# Patient Record
Sex: Female | Born: 1975 | Race: White | Hispanic: Refuse to answer | Marital: Single | Smoking: Former smoker
Health system: Southern US, Community
[De-identification: ages and names within clinical notes are randomized; demographics above are authoritative.]

## PROBLEM LIST (undated history)

## (undated) DIAGNOSIS — G44229 Chronic tension-type headache, not intractable: Secondary | ICD-10-CM

## (undated) DIAGNOSIS — N200 Calculus of kidney: Secondary | ICD-10-CM

## (undated) DIAGNOSIS — Z87442 Personal history of urinary calculi: Secondary | ICD-10-CM

## (undated) DIAGNOSIS — K219 Gastro-esophageal reflux disease without esophagitis: Secondary | ICD-10-CM

## (undated) DIAGNOSIS — I10 Essential (primary) hypertension: Secondary | ICD-10-CM

---

## 2006-02-06 ENCOUNTER — Emergency Department: Payer: Self-pay | Admitting: Emergency Medicine

## 2006-09-23 ENCOUNTER — Emergency Department: Payer: Self-pay | Admitting: Unknown Physician Specialty

## 2007-03-04 ENCOUNTER — Emergency Department: Payer: Self-pay | Admitting: Emergency Medicine

## 2007-05-24 ENCOUNTER — Emergency Department: Payer: Self-pay

## 2007-11-14 ENCOUNTER — Emergency Department: Payer: Self-pay | Admitting: Emergency Medicine

## 2008-04-01 ENCOUNTER — Emergency Department: Payer: Self-pay | Admitting: Emergency Medicine

## 2009-05-22 ENCOUNTER — Observation Stay: Payer: Self-pay | Admitting: Obstetrics and Gynecology

## 2009-10-14 ENCOUNTER — Ambulatory Visit: Payer: Self-pay | Admitting: Cardiovascular Disease

## 2009-10-14 ENCOUNTER — Inpatient Hospital Stay: Payer: Self-pay | Admitting: Obstetrics & Gynecology

## 2009-10-21 ENCOUNTER — Inpatient Hospital Stay: Payer: Self-pay

## 2009-12-12 ENCOUNTER — Observation Stay: Payer: Self-pay

## 2009-12-14 ENCOUNTER — Observation Stay: Payer: Self-pay

## 2009-12-16 ENCOUNTER — Observation Stay: Payer: Self-pay

## 2009-12-18 ENCOUNTER — Inpatient Hospital Stay: Payer: Self-pay | Admitting: Obstetrics and Gynecology

## 2011-09-17 ENCOUNTER — Emergency Department: Payer: Self-pay | Admitting: Emergency Medicine

## 2011-09-17 LAB — URINALYSIS, COMPLETE
Bilirubin,UR: NEGATIVE
Nitrite: NEGATIVE
Protein: 30
Specific Gravity: 1.019 (ref 1.003–1.030)
WBC UR: 6 /HPF (ref 0–5)

## 2012-12-22 ENCOUNTER — Emergency Department: Payer: Self-pay | Admitting: Internal Medicine

## 2012-12-22 LAB — URINALYSIS, COMPLETE
Bacteria: NONE SEEN
Bilirubin,UR: NEGATIVE
Blood: NEGATIVE
Glucose,UR: NEGATIVE mg/dL (ref 0–75)
Ketone: NEGATIVE
Protein: NEGATIVE
RBC,UR: <1 /HPF (ref 0–5)
Specific Gravity: 1.011 (ref 1.003–1.030)
Squamous Epithelial: 1
WBC UR: 1 /HPF (ref 0–5)

## 2012-12-22 LAB — COMPREHENSIVE METABOLIC PANEL
Albumin: 4 g/dL (ref 3.4–5.0)
BUN: 12 mg/dL (ref 7–18)
Bilirubin,Total: 0.5 mg/dL (ref 0.2–1.0)
Calcium, Total: 9.6 mg/dL (ref 8.5–10.1)
Chloride: 105 mmol/L (ref 98–107)
EGFR (Non-African Amer.): >60
Osmolality: 273 (ref 275–301)
Potassium: 3.9 mmol/L (ref 3.5–5.1)
SGPT (ALT): 31 U/L (ref 12–78)
Sodium: 137 mmol/L (ref 136–145)
Total Protein: 7.4 g/dL (ref 6.4–8.2)

## 2012-12-22 LAB — CBC
HCT: 38.9 % (ref 35.0–47.0)
HGB: 13.9 g/dL (ref 12.0–16.0)
MCHC: 35.7 g/dL (ref 32.0–36.0)
Platelet: 217 10*3/uL (ref 150–440)
RBC: 4.21 10*6/uL (ref 3.80–5.20)
RDW: 12.4 % (ref 11.5–14.5)
WBC: 5 10*3/uL (ref 3.6–11.0)

## 2014-11-10 ENCOUNTER — Encounter: Payer: Self-pay | Admitting: General Practice

## 2014-11-10 ENCOUNTER — Emergency Department: Payer: No Typology Code available for payment source

## 2014-11-10 ENCOUNTER — Emergency Department
Admission: EM | Admit: 2014-11-10 | Discharge: 2014-11-10 | Disposition: A | Discharge status: Home / Self Care | Payer: No Typology Code available for payment source | Attending: Emergency Medicine | Admitting: Emergency Medicine

## 2014-11-10 DIAGNOSIS — Z720 Tobacco use: Secondary | ICD-10-CM | POA: Insufficient documentation

## 2014-11-10 DIAGNOSIS — N200 Calculus of kidney: Secondary | ICD-10-CM | POA: Diagnosis not present

## 2014-11-10 DIAGNOSIS — Z79899 Other long term (current) drug therapy: Secondary | ICD-10-CM | POA: Diagnosis not present

## 2014-11-10 DIAGNOSIS — R109 Unspecified abdominal pain: Secondary | ICD-10-CM

## 2014-11-10 DIAGNOSIS — Z3202 Encounter for pregnancy test, result negative: Secondary | ICD-10-CM | POA: Insufficient documentation

## 2014-11-10 HISTORY — DX: Calculus of kidney: N20.0

## 2014-11-10 LAB — COMPREHENSIVE METABOLIC PANEL
ALT: 14 U/L (ref 14–54)
AST: 26 U/L (ref 15–41)
Albumin: 3.9 g/dL (ref 3.5–5.0)
Alkaline Phosphatase: 46 U/L (ref 38–126)
Anion gap: 7 (ref 5–15)
BUN: 22 mg/dL — ABNORMAL HIGH (ref 6–20)
CALCIUM: 8.9 mg/dL (ref 8.9–10.3)
CO2: 23 mmol/L (ref 22–32)
Chloride: 110 mmol/L (ref 101–111)
Creatinine, Ser: 0.92 mg/dL (ref 0.44–1.00)
GFR calc non Af Amer: >60 mL/min (ref >60)
GLUCOSE: 89 mg/dL (ref 65–99)
Potassium: 5.1 mmol/L (ref 3.5–5.1)
Sodium: 140 mmol/L (ref 135–145)
Total Bilirubin: 0.5 mg/dL (ref 0.3–1.2)
Total Protein: 6.5 g/dL (ref 6.5–8.1)

## 2014-11-10 LAB — URINALYSIS COMPLETE WITH MICROSCOPIC (ARMC ONLY)
BACTERIA UA: NONE SEEN
Bilirubin Urine: NEGATIVE
GLUCOSE, UA: NEGATIVE mg/dL
Ketones, ur: NEGATIVE mg/dL
NITRITE: NEGATIVE
PROTEIN: 30 mg/dL — AB
Specific Gravity, Urine: 1.016 (ref 1.005–1.030)
pH: 8 (ref 5.0–8.0)

## 2014-11-10 LAB — CBC WITH DIFFERENTIAL/PLATELET
Basophils Absolute: 0 10*3/uL (ref 0–0.1)
Basophils Relative: 1 %
EOS ABS: 0.6 10*3/uL (ref 0–0.7)
EOS PCT: 9 %
HCT: 37.6 % (ref 35.0–47.0)
HEMOGLOBIN: 12.7 g/dL (ref 12.0–16.0)
LYMPHS ABS: 1.7 10*3/uL (ref 1.0–3.6)
LYMPHS PCT: 24 %
MCH: 32.3 pg (ref 26.0–34.0)
MCHC: 33.7 g/dL (ref 32.0–36.0)
MCV: 95.9 fL (ref 80.0–100.0)
MONOS PCT: 12 %
Monocytes Absolute: 0.8 10*3/uL (ref 0.2–0.9)
NEUTROS PCT: 54 %
Neutro Abs: 3.8 10*3/uL (ref 1.4–6.5)
Platelets: 202 10*3/uL (ref 150–440)
RBC: 3.93 MIL/uL (ref 3.80–5.20)
RDW: 13.3 % (ref 11.5–14.5)
WBC: 6.9 10*3/uL (ref 3.6–11.0)

## 2014-11-10 LAB — LIPASE, BLOOD: LIPASE: 41 U/L (ref 22–51)

## 2014-11-10 LAB — POCT PREGNANCY, URINE: Preg Test, Ur: NEGATIVE

## 2014-11-10 MED ORDER — SODIUM CHLORIDE 0.9 % IV BOLUS (SEPSIS)
1000.0000 mL | Freq: Once | INTRAVENOUS | Status: AC
  Administered 2014-11-10: 1000 mL via INTRAVENOUS

## 2014-11-10 MED ORDER — ONDANSETRON HCL 4 MG/2ML IJ SOLN
4.0000 mg | INTRAMUSCULAR | Status: AC
  Administered 2014-11-10: 4 mg via INTRAVENOUS

## 2014-11-10 MED ORDER — ONDANSETRON HCL 4 MG/2ML IJ SOLN
INTRAMUSCULAR | Status: AC
  Administered 2014-11-10: 4 mg via INTRAVENOUS
  Filled 2014-11-10: qty 2

## 2014-11-10 MED ORDER — KETOROLAC TROMETHAMINE 30 MG/ML IJ SOLN
INTRAMUSCULAR | Status: AC
  Administered 2014-11-10: 30 mg via INTRAVENOUS
  Filled 2014-11-10: qty 1

## 2014-11-10 MED ORDER — MORPHINE SULFATE 4 MG/ML IJ SOLN
4.0000 mg | Freq: Once | INTRAMUSCULAR | Status: AC
  Administered 2014-11-10: 4 mg via INTRAVENOUS

## 2014-11-10 MED ORDER — ONDANSETRON 4 MG PO TBDP: 4.0000 mg | ORAL_TABLET | Freq: Four times a day (QID) | ORAL | Status: DC | PRN

## 2014-11-10 MED ORDER — KETOROLAC TROMETHAMINE 30 MG/ML IJ SOLN
30.0000 mg | Freq: Once | INTRAMUSCULAR | Status: AC
  Administered 2014-11-10: 30 mg via INTRAVENOUS

## 2014-11-10 MED ORDER — OXYCODONE-ACETAMINOPHEN 5-325 MG PO TABS: 1.0000 | ORAL_TABLET | Freq: Four times a day (QID) | ORAL | Status: DC | PRN

## 2014-11-10 NOTE — ED Notes (Signed)
Patient transported to CT 

## 2014-11-10 NOTE — ED Notes (Signed)
Pt back from CT, additional anti nausea med given, family at bedside, awaiting CT results

## 2014-11-10 NOTE — Discharge Instructions (Signed)

## 2014-11-10 NOTE — ED Provider Notes (Signed)
[facility] Emergency Department Provider Note  ____________________________________________  Time seen: Approximately 8:55 AM  I have reviewed the triage vital signs and the nursing notes.   HISTORY  Chief Complaint Flank Pain; Abdominal Pain; Hematuria; and Nausea    HPI Amber Mcbride is a 39 y.o. female states she has a history of previous kidney stones. She states yesterday she had a "nagging" feeling as if a kidney stone may be starting in her left flank. This morning she woke up and had severe sharp pain in her left flank which radiates down towards the left lower groin. States this feels just like previous kidney stones. She does report having to have a stent for a kidney stone that was about 12 mm in size previously. She has been seen by [facility] in the past.  She currently denies taking any pain medications. She is allergic to no medications. She denies pregnancy. No vaginal symptoms. No vaginal bleeding or discharge. No nausea or vomiting. No chest pain or shortness of breath.  Pain is severe. 10 out of 10. Located in the left flank.  Past Medical History  Diagnosis Date  . Kidney stones     There are no active problems to display for this patient.   History reviewed. No pertinent past surgical history.  Current Outpatient Rx  Name  Route  Sig  Dispense  Refill  . losartan (COZAAR) 25 MG tablet   Oral   Take 25 mg by mouth daily.      11   . omeprazole (PRILOSEC) 20 MG capsule   Oral   Take 20 mg by mouth daily as needed.      11   . valACYclovir (VALTREX) 500 MG tablet   Oral   Take 500 mg by mouth daily.      11   . ondansetron (ZOFRAN ODT) 4 MG disintegrating tablet   Oral   Take 1 tablet (4 mg total) by mouth every 6 (six) hours as needed for nausea or vomiting.   20 tablet   0   . oxyCODONE-acetaminophen (ROXICET) 5-325 MG per tablet   Oral   Take 1 tablet by mouth every 6 (six) hours as needed for severe pain.    20 tablet   0     Allergies Review of patient's allergies indicates no known allergies.  No family history on file.  Social History History  Substance Use Topics  . Smoking status: Current Every Day Smoker -- 1.00 packs/day    Types: Cigarettes  . Smokeless tobacco: Not on file  . Alcohol Use: No    Review of Systems Constitutional: No fever/chills Eyes: No visual changes. ENT: No sore throat. Cardiovascular: Denies chest pain. Respiratory: Denies shortness of breath. Gastrointestinal: See history of present illness No nausea, no vomiting.  No diarrhea.  No constipation. Genitourinary: Negative for dysuria. Musculoskeletal: Negative for back pain. Skin: Negative for rash. Neurological: Negative for headaches, focal weakness or numbness.  10-point ROS otherwise negative.  ____________________________________________   PHYSICAL EXAM:  VITAL SIGNS: ED Triage Vitals  Enc Vitals Group     BP 11/10/14 0819 138/86 mmHg     Pulse Rate 11/10/14 0819 89     Resp 11/10/14 0819 19     Temp 11/10/14 0819 97.9 F (36.6 C)     Temp Source 11/10/14 0819 Oral     SpO2 11/10/14 0819 97 %     Weight 11/10/14 0819 132 lb (59.875 kg)  Height 11/10/14 0819  (1.6 m)     Head Cir --      Peak Flow --      Pain Score 11/10/14 0820 10     Pain Loc --      Pain Edu? --      Excl. in GC? --     Constitutional: Alert and oriented. Holding her left flank writhing somewhat in bed. She appears to be in severe pain.  Eyes: Conjunctivae are normal. PERRL. EOMI. Head: Atraumatic. Nose: No congestion/rhinnorhea. Mouth/Throat: Mucous membranes are moist.  Oropharynx non-erythematous. Neck: No stridor.   Cardiovascular: Normal rate, regular rhythm. Grossly normal heart sounds.  Good peripheral circulation. Respiratory: Normal respiratory effort.  No retractions. Lungs CTAB. Gastrointestinal: Soft and nontender with exception of moderate tenderness the left lank without rebound or  guarding. No distention. No abdominal bruits. There is CVA tenderness on the left. None on the right. Musculoskeletal: No lower extremity tenderness nor edema.  No joint effusions. Neurologic:  Normal speech and language. No gross focal neurologic deficits are appreciated. Speech is normal.  Skin:  Skin is warm, dry and intact. No rash noted. Psychiatric: Mood and affect are normal. Speech and behavior are normal.  ____________________________________________   LABS (all labs ordered are listed, but only abnormal results are displayed)  Labs Reviewed  COMPREHENSIVE METABOLIC PANEL - Abnormal; Notable for the following:    BUN 22 (*)    All other components within normal limits  URINALYSIS COMPLETEWITH MICROSCOPIC ([facility] ONLY) - Abnormal; Notable for the following:    Color, Urine YELLOW (*)    APPearance CLOUDY (*)    Hgb urine dipstick 3+ (*)    Protein, ur 30 (*)    Leukocytes, UA TRACE (*)    Squamous Epithelial / LPF 0-5 (*)    All other components within normal limits  CBC WITH DIFFERENTIAL/PLATELET  LIPASE, BLOOD  POC URINE PREG, ED  POC URINE PREG, ED  POCT PREGNANCY, URINE   ____________________________________________  EKG   ____________________________________________  RADIOLOGY  CT Abdomen Pelvis W Contrast (Final result) Result time: 11/10/14 11:28:32   Final result by Rad Results In Interface (11/10/14 11:28:32)   Narrative:   CLINICAL DATA: Severe mid abdominal/back pain since fall and seizure, history of melanoma  EXAM: CT ABDOMEN AND PELVIS WITH CONTRAST  TECHNIQUE: Multidetector CT imaging of the abdomen and pelvis was performed using the standard protocol following bolus administration of intravenous contrast.  CONTRAST: OMNIPAQUE IOHEXOL 300 MG/ML SOLN  COMPARISON: None.  FINDINGS: Motion degraded images.  Lower chest: Mild dependent atelectasis at the right lung base.  Hepatobiliary: Liver is within normal  limits.  Gallbladder is unremarkable. No intrahepatic or extrahepatic ductal dilatation.  Pancreas: Within normal limits.  Spleen: Within normal limits.  Adrenals/Urinary Tract: Adrenal glands are within normal limits.  Kidneys within normal limits. No hydronephrosis.  Thick-walled bladder.  Stomach/Bowel: Stomach is within normal limits.  No evidence of bowel obstruction.  Prior appendectomy.  Vascular/Lymphatic: Atherosclerotic calcifications of the abdominal aorta and branch vessels.  No suspicious abdominopelvic lymphadenopathy.  Reproductive: Prostate is notable for dystrophic calcifications.  Other: No abdominopelvic ascites.  Musculoskeletal: Degenerative changes of the lower lumbar spine, most prominent at L4-5 and L5-S1.  No fracture is seen.  IMPRESSION: Motion degraded images.  No evidence of traumatic injury to the abdomen/ pelvis.  Thick-walled bladder.   Electronically Signed By: Charline Bills M.D. On: 11/10/2014 11:28          CT Head Wo  Contrast (Final result) Result time: 11/10/14 09:28:09   Final result by Rad Results In Interface (11/10/14 09:28:09)   Narrative:   CLINICAL DATA: Seizure and fall  EXAM: CT HEAD WITHOUT CONTRAST  CT CERVICAL SPINE WITHOUT CONTRAST  TECHNIQUE: Multidetector CT imaging of the head and cervical spine was performed following the standard protocol without intravenous contrast. Multiplanar CT image reconstructions of the cervical spine were also generated.  COMPARISON: 10/31/2014  FINDINGS: CT HEAD FINDINGS  There is no mass effect, midline shift, or acute intracranial hemorrhage. Soft tissue swelling over the left frontal bone is noted. Extensive consolidation in the paranasal sinuses is not significantly changed. Mastoid air cells are clear. Prominent adenoidal lymphoid tissue is noted. No skull fracture.  CT CERVICAL SPINE FINDINGS  No acute fracture. No  dislocation.  Advanced degenerative change throughout the cervical spine is present. There is severe disc space narrowing and vacuum at C4-5 with posterior osteophytic ridging. Right sided uncovertebral osteophytes cause right foraminal narrowing. There is mild disc narrowing at C5-6 with bilateral uncovertebral osteophytes. Moderate C6-7 disc space narrowing with bilateral uncovertebral osteophytes.  Emphysema at the lung apices is prominent.  No obvious spinal hemorrhage or soft tissue injury.  IMPRESSION: No acute intracranial pathology. No evidence of cervical spine injury.   Electronically Signed By: Jolaine Click M.D. On: 11/10/2014 09:28          CT Cervical Spine Wo Contrast (Final result) Result time: 11/10/14 09:28:09   Final result by Rad Results In Interface (11/10/14 09:28:09)   Narrative:   CLINICAL DATA: Seizure and fall  EXAM: CT HEAD WITHOUT CONTRAST  CT CERVICAL SPINE WITHOUT CONTRAST  TECHNIQUE: Multidetector CT imaging of the head and cervical spine was performed following the standard protocol without intravenous contrast. Multiplanar CT image reconstructions of the cervical spine were also generated.  COMPARISON: 10/31/2014  FINDINGS: CT HEAD FINDINGS  There is no mass effect, midline shift, or acute intracranial hemorrhage. Soft tissue swelling over the left frontal bone is noted. Extensive consolidation in the paranasal sinuses is not significantly changed. Mastoid air cells are clear. Prominent adenoidal lymphoid tissue is noted. No skull fracture.  CT CERVICAL SPINE FINDINGS  No acute fracture. No dislocation.  Advanced degenerative change throughout the cervical spine is present. There is severe disc space narrowing and vacuum at C4-5 with posterior osteophytic ridging. Right sided uncovertebral osteophytes cause right foraminal narrowing. There is mild disc narrowing at C5-6 with bilateral uncovertebral osteophytes.  Moderate C6-7 disc space narrowing with bilateral uncovertebral osteophytes.  Emphysema at the lung apices is prominent.  No obvious spinal hemorrhage or soft tissue injury.  IMPRESSION: No acute intracranial pathology. No evidence of cervical spine injury.     ____________________________________________   PROCEDURES  Procedure(s) performed: None  Critical Care performed: No  ____________________________________________   INITIAL IMPRESSION / ASSESSMENT AND PLAN / ED COURSE  Pertinent labs & imaging results that were available during my care of the patient were reviewed by me and considered in my medical decision making (see chart for details).  Severe left flank pain. This is likely recurrent kidney stone, but given the patient's history of large kidney stones requiring stenting in the past, I will obtain CT imaging to evaluate for sizable stone or other acute etiology of pain. Patient does not appear to have any gynecologic symptoms, no gastrointestinal symptoms other than left flank pain which is likely due to kidney stone. No cardiopulmonary symptoms. We will control pain, obtain basic labs, urinalysis, and urine pregnancy test.  We'll plan to perform CT stone protocol to further elucidate etiology of pain.  ----------------------------------------- 9:17 AM on 11/10/2014 -----------------------------------------  Patient continues. Severe pain, reports no relief with morphine. We'll give Toradol at this time, she states is no chance she could be pregnant she has no previous history of any kidney disease. No allergies to any NSAIDs.  ----------------------------------------- 12:55 PM on 11/10/2014 -----------------------------------------  Patient is resting comfortably and her pain is much control. She does have a left ureteral stone. I will discharge her to home with pain control, there is a decent likely that she will pass this on her own. I discussed with her  appropriate and safe use of Percocet and she is agreeable. Return precautions advised. She'll follow up with [facility] urology. No evidence of infection. ____________________________________________  Discussed with the patient and her husband no driving while taking Percocet or today, patient is agreeable.  FINAL CLINICAL IMPRESSION(S) / ED DIAGNOSES  Final diagnoses:  Left flank pain  Kidney stone      Sharyn Creamer, MD 11/10/14 1258

## 2014-11-10 NOTE — ED Notes (Signed)
Pt. Arrived to ed from home with reports of experiencing left flank pain radiating to left lower abdomen. Pt reports hx of kidney stones. Pt alert and oriented. Pt vebalized having blood in her urine.

## 2014-11-15 ENCOUNTER — Emergency Department: Payer: No Typology Code available for payment source

## 2014-11-15 ENCOUNTER — Encounter: Payer: Self-pay | Admitting: Emergency Medicine

## 2014-11-15 ENCOUNTER — Emergency Department
Admission: EM | Admit: 2014-11-15 | Discharge: 2014-11-15 | Disposition: A | Discharge status: Home / Self Care | Payer: No Typology Code available for payment source | Attending: Emergency Medicine | Admitting: Emergency Medicine

## 2014-11-15 DIAGNOSIS — Z720 Tobacco use: Secondary | ICD-10-CM | POA: Insufficient documentation

## 2014-11-15 DIAGNOSIS — K5901 Slow transit constipation: Secondary | ICD-10-CM | POA: Diagnosis not present

## 2014-11-15 DIAGNOSIS — R109 Unspecified abdominal pain: Secondary | ICD-10-CM | POA: Diagnosis present

## 2014-11-15 DIAGNOSIS — R1084 Generalized abdominal pain: Secondary | ICD-10-CM

## 2014-11-15 LAB — BASIC METABOLIC PANEL
Anion gap: 7 (ref 5–15)
BUN: 18 mg/dL (ref 6–20)
CALCIUM: 9.2 mg/dL (ref 8.9–10.3)
CHLORIDE: 105 mmol/L (ref 101–111)
CO2: 28 mmol/L (ref 22–32)
Creatinine, Ser: 1.03 mg/dL — ABNORMAL HIGH (ref 0.44–1.00)
GFR calc Af Amer: >60 mL/min (ref >60)
Glucose, Bld: 88 mg/dL (ref 65–99)
Potassium: 3.6 mmol/L (ref 3.5–5.1)
Sodium: 140 mmol/L (ref 135–145)

## 2014-11-15 LAB — URINALYSIS COMPLETE WITH MICROSCOPIC (ARMC ONLY)
Bacteria, UA: NONE SEEN
Bilirubin Urine: NEGATIVE
GLUCOSE, UA: NEGATIVE mg/dL
Hgb urine dipstick: NEGATIVE
Ketones, ur: NEGATIVE mg/dL
NITRITE: NEGATIVE
Protein, ur: NEGATIVE mg/dL
Specific Gravity, Urine: 1.019 (ref 1.005–1.030)
pH: 6 (ref 5.0–8.0)

## 2014-11-15 LAB — CBC WITH DIFFERENTIAL/PLATELET
BASOS ABS: 0.1 10*3/uL (ref 0–0.1)
Basophils Relative: 1 %
EOS PCT: 6 %
Eosinophils Absolute: 0.4 10*3/uL (ref 0–0.7)
HEMATOCRIT: 38.2 % (ref 35.0–47.0)
HEMOGLOBIN: 13 g/dL (ref 12.0–16.0)
LYMPHS PCT: 23 %
Lymphs Abs: 1.6 10*3/uL (ref 1.0–3.6)
MCH: 32.2 pg (ref 26.0–34.0)
MCHC: 34 g/dL (ref 32.0–36.0)
MCV: 94.5 fL (ref 80.0–100.0)
Monocytes Absolute: 0.9 10*3/uL (ref 0.2–0.9)
Monocytes Relative: 13 %
NEUTROS ABS: 4.1 10*3/uL (ref 1.4–6.5)
NEUTROS PCT: 57 %
Platelets: 210 10*3/uL (ref 150–440)
RBC: 4.05 MIL/uL (ref 3.80–5.20)
RDW: 13.6 % (ref 11.5–14.5)
WBC: 7.1 10*3/uL (ref 3.6–11.0)

## 2014-11-15 MED ORDER — HYDROMORPHONE HCL 1 MG/ML IJ SOLN
1.0000 mg | Freq: Once | INTRAMUSCULAR | Status: AC
  Administered 2014-11-15: 1 mg via INTRAVENOUS
  Filled 2014-11-15: qty 1

## 2014-11-15 MED ORDER — ONDANSETRON HCL 4 MG/2ML IJ SOLN
4.0000 mg | Freq: Once | INTRAMUSCULAR | Status: AC
  Administered 2014-11-15: 4 mg via INTRAVENOUS
  Filled 2014-11-15: qty 2

## 2014-11-15 MED ORDER — ONDANSETRON HCL 4 MG/2ML IJ SOLN
4.0000 mg | Freq: Once | INTRAMUSCULAR | Status: AC
  Administered 2014-11-15: 4 mg via INTRAVENOUS

## 2014-11-15 MED ORDER — ONDANSETRON HCL 4 MG/2ML IJ SOLN
INTRAMUSCULAR | Status: AC
  Filled 2014-11-15: qty 2

## 2014-11-15 MED ORDER — SODIUM CHLORIDE 0.9 % IV SOLN
Freq: Once | INTRAVENOUS | Status: AC
  Administered 2014-11-15: 15:00:00 via INTRAVENOUS

## 2014-11-15 MED ORDER — POLYETHYLENE GLYCOL 3350 17 G PO PACK: 17.0000 g | PACK | Freq: Every day | ORAL | Status: DC

## 2014-11-15 NOTE — ED Provider Notes (Signed)
[facility] Emergency Department Provider Note     Time seen: ----------------------------------------- 2:40 PM on 11/15/2014 -----------------------------------------    I have reviewed the triage vital signs and the nursing notes.   HISTORY  Chief Complaint Flank Pain    HPI Amber Mcbride is a 39 y.o. female who presents ER after she was recently diagnosed with kidney stones. Patient states she is having pain that wraps around the front of the abdomen. Patient reports she has not been in to see a urologist because there is a waiting list currently. Denies fevers chills other complaints. Denies any vomiting. States Percocet is not really helping her pain. She is also taking Zofran and Goody powders. Pain is currently severe and bilateral flanks. Nothing makes it worse.   Past Medical History  Diagnosis Date  . Kidney stones     There are no active problems to display for this patient.   History reviewed. No pertinent past surgical history.  Allergies Review of patient's allergies indicates no known allergies.  Social History History  Substance Use Topics  . Smoking status: Current Every Day Smoker -- 1.00 packs/day    Types: Cigarettes  . Smokeless tobacco: Not on file  . Alcohol Use: No    Review of Systems Constitutional: Negative for fever. Eyes: Negative for visual changes. ENT: Negative for sore throat. Cardiovascular: Negative for chest pain. Respiratory: Negative for shortness of breath. Gastrointestinal: Positive for bilateral abdominal pain Genitourinary: Negative for dysuria. Musculoskeletal: Positive for low back pain and flank pain bilaterally Skin: Negative for rash. Neurological: Negative for headaches, focal weakness or numbness.  10-point ROS otherwise negative.  ____________________________________________   PHYSICAL EXAM:  VITAL SIGNS: ED Triage Vitals  Enc Vitals Group     BP 11/15/14 1412 124/76 mmHg   Pulse Rate 11/15/14 1412 85     Resp 11/15/14 1412 16     Temp 11/15/14 1412 97.8 F (36.6 C)     Temp Source 11/15/14 1412 Oral     SpO2 11/15/14 1412 100 %     Weight 11/15/14 1412 133 lb (60.328 kg)     Height 11/15/14 1412  (1.6 m)     Head Cir --      Peak Flow --      Pain Score 11/15/14 1413 8     Pain Loc --      Pain Edu? --      Excl. in GC? --     Constitutional: Alert and oriented. Well appearing and in no distress. Eyes: Conjunctivae are normal. PERRL. Normal extraocular movements. ENT   Head: Normocephalic and atraumatic.   Nose: No congestion/rhinnorhea.   Mouth/Throat: Mucous membranes are moist.   Neck: No stridor. Hematological/Lymphatic/Immunilogical: No cervical lymphadenopathy. Cardiovascular: Normal rate, regular rhythm. Normal and symmetric distal pulses are present in all extremities. No murmurs, rubs, or gallops. Respiratory: Normal respiratory effort without tachypnea nor retractions. Breath sounds are clear and equal bilaterally. No wheezes/rales/rhonchi. Gastrointestinal: Soft and nontender. No distention. No abdominal bruits. There is no CVA tenderness. Musculoskeletal: Nontender with normal range of motion in all extremities. No joint effusions.  No lower extremity tenderness nor edema. Neurologic:  Normal speech and language. No gross focal neurologic deficits are appreciated. Speech is normal. No gait instability. Skin:  Skin is warm, dry and intact. No rash noted. Psychiatric: Mood and affect are normal. Speech and behavior are normal. Patient exhibits appropriate insight and judgment. ____________________________________________  ED COURSE:  Pertinent labs & imaging results that  were available during my care of the patient were reviewed by me and considered in my medical decision making (see chart for details). We'll recheck his urine, KUB and give IV pain medication. ____________________________________________    LABS  (pertinent positives/negatives)  Labs Reviewed  URINALYSIS COMPLETEWITH MICROSCOPIC ([facility] ONLY) - Abnormal; Notable for the following:    Color, Urine YELLOW (*)    APPearance CLEAR (*)    Leukocytes, UA TRACE (*)    Squamous Epithelial / LPF 0-5 (*)    All other components within normal limits  BASIC METABOLIC PANEL - Abnormal; Notable for the following:    Creatinine, Ser 1.03 (*)    All other components within normal limits  CBC WITH DIFFERENTIAL/PLATELET    RADIOLOGY Images were viewed by me  KUB Reveals intrarenal stones, she has likely passed her previous stone ____________________________________________  FINAL ASSESSMENT AND PLAN  Flank pain, constipation  Plan: Patient with abdominal pain and likely recently passed kidney stone. Her pain at this point is probably from constipation. Her urine has cleared up and her x-rays unremarkable this time she'll be discharged with MiraLAX and follow up as needed.   Emily FilbertWilliams, Jonathan E, MD   Emily FilbertJonathan E Williams, MD 11/15/14 [phone]

## 2014-11-15 NOTE — ED Notes (Addendum)
Reports dx with kidney stones Saturday.  States she can not get a f/u apt until mid august.  Still having pain.

## 2014-11-15 NOTE — ED Notes (Signed)
Patient states she was here Saturday with diagnoses of kidney stones on the left side.  Patient now complaining of pain bilaterally and into her pubic area.  Patient has not been able to f/u with a urologist since she was discharged.  Has mild nausea and a headache.

## 2014-11-15 NOTE — Discharge Instructions (Signed)

## 2015-04-10 ENCOUNTER — Emergency Department: Payer: No Typology Code available for payment source

## 2015-04-10 ENCOUNTER — Emergency Department
Admission: EM | Admit: 2015-04-10 | Discharge: 2015-04-10 | Disposition: A | Discharge status: Home / Self Care | Payer: No Typology Code available for payment source | Attending: Emergency Medicine | Admitting: Emergency Medicine

## 2015-04-10 ENCOUNTER — Encounter: Payer: Self-pay | Admitting: *Deleted

## 2015-04-10 DIAGNOSIS — N23 Unspecified renal colic: Secondary | ICD-10-CM | POA: Insufficient documentation

## 2015-04-10 DIAGNOSIS — Z3202 Encounter for pregnancy test, result negative: Secondary | ICD-10-CM | POA: Insufficient documentation

## 2015-04-10 DIAGNOSIS — F17210 Nicotine dependence, cigarettes, uncomplicated: Secondary | ICD-10-CM | POA: Diagnosis not present

## 2015-04-10 DIAGNOSIS — Z79899 Other long term (current) drug therapy: Secondary | ICD-10-CM | POA: Diagnosis not present

## 2015-04-10 DIAGNOSIS — R109 Unspecified abdominal pain: Secondary | ICD-10-CM | POA: Diagnosis present

## 2015-04-10 HISTORY — DX: Calculus of kidney: N20.0

## 2015-04-10 LAB — BASIC METABOLIC PANEL
Anion gap: 5 (ref 5–15)
BUN: 17 mg/dL (ref 6–20)
CHLORIDE: 109 mmol/L (ref 101–111)
CO2: 25 mmol/L (ref 22–32)
Calcium: 8.8 mg/dL — ABNORMAL LOW (ref 8.9–10.3)
Creatinine, Ser: 0.79 mg/dL (ref 0.44–1.00)
GFR calc Af Amer: >60 mL/min (ref >60)
GFR calc non Af Amer: >60 mL/min (ref >60)
Glucose, Bld: 82 mg/dL (ref 65–99)
POTASSIUM: 3.7 mmol/L (ref 3.5–5.1)
Sodium: 139 mmol/L (ref 135–145)

## 2015-04-10 LAB — CBC WITH DIFFERENTIAL/PLATELET
BASOS ABS: 0 10*3/uL (ref 0–0.1)
BASOS PCT: 1 %
EOS ABS: 0.2 10*3/uL (ref 0–0.7)
Eosinophils Relative: 4 %
HCT: 39.3 % (ref 35.0–47.0)
Hemoglobin: 13.5 g/dL (ref 12.0–16.0)
Lymphocytes Relative: 25 %
Lymphs Abs: 1.4 10*3/uL (ref 1.0–3.6)
MCH: 33.9 pg (ref 26.0–34.0)
MCHC: 34.4 g/dL (ref 32.0–36.0)
MCV: 98.5 fL (ref 80.0–100.0)
Monocytes Absolute: 0.6 10*3/uL (ref 0.2–0.9)
Monocytes Relative: 10 %
NEUTROS ABS: 3.3 10*3/uL (ref 1.4–6.5)
NEUTROS PCT: 60 %
Platelets: 218 10*3/uL (ref 150–440)
RBC: 3.99 MIL/uL (ref 3.80–5.20)
RDW: 11.9 % (ref 11.5–14.5)
WBC: 5.5 10*3/uL (ref 3.6–11.0)

## 2015-04-10 LAB — URINALYSIS COMPLETE WITH MICROSCOPIC (ARMC ONLY)
Bacteria, UA: NONE SEEN
Bilirubin Urine: NEGATIVE
Glucose, UA: NEGATIVE mg/dL
Ketones, ur: NEGATIVE mg/dL
LEUKOCYTES UA: NEGATIVE
Nitrite: NEGATIVE
PH: 6 (ref 5.0–8.0)
Protein, ur: NEGATIVE mg/dL
Specific Gravity, Urine: 1.01 (ref 1.005–1.030)

## 2015-04-10 LAB — PREGNANCY, URINE: PREG TEST UR: NEGATIVE

## 2015-04-10 LAB — POCT PREGNANCY, URINE: Preg Test, Ur: NEGATIVE

## 2015-04-10 MED ORDER — ONDANSETRON 4 MG PO TBDP
4.0000 mg | ORAL_TABLET | Freq: Once | ORAL | Status: AC
  Administered 2015-04-10: 4 mg via ORAL

## 2015-04-10 MED ORDER — ONDANSETRON 4 MG PO TBDP
ORAL_TABLET | ORAL | Status: AC
  Administered 2015-04-10: 4 mg via ORAL
  Filled 2015-04-10: qty 1

## 2015-04-10 MED ORDER — ONDANSETRON HCL 4 MG/2ML IJ SOLN
4.0000 mg | Freq: Once | INTRAMUSCULAR | Status: AC
  Administered 2015-04-10: 4 mg via INTRAVENOUS
  Filled 2015-04-10: qty 2

## 2015-04-10 MED ORDER — TAMSULOSIN HCL 0.4 MG PO CAPS
0.4000 mg | ORAL_CAPSULE | Freq: Every day | ORAL | Status: DC
Start: 2015-04-10 — End: 2019-09-07

## 2015-04-10 MED ORDER — OXYCODONE-ACETAMINOPHEN 10-325 MG PO TABS: 1.0000 | ORAL_TABLET | Freq: Four times a day (QID) | ORAL | Status: AC | PRN

## 2015-04-10 MED ORDER — HYDROMORPHONE HCL 1 MG/ML IJ SOLN
1.0000 mg | Freq: Once | INTRAMUSCULAR | Status: AC
  Administered 2015-04-10: 1 mg via INTRAVENOUS
  Filled 2015-04-10: qty 1

## 2015-04-10 NOTE — ED Notes (Signed)
Pt here with c/o left flank pain x 5 days. Advises it's a "kidney stone". Pt with Hx of multiple stones

## 2015-04-10 NOTE — ED Provider Notes (Signed)
[facility] Emergency Department Provider Note  ____________________________________________  Time seen: Approximately 12:18 PM  I have reviewed the triage vital signs and the nursing notes.   HISTORY  Chief Complaint Nephrolithiasis    HPI Amber Mcbride is a 39 y.o. female patient describes left flank pain. This is consistent with her usual renal colic pain although not as severe as usually. Radiates around to the groin she denies any hematuria she denies any nausea vomiting or diarrhea   Past Medical History  Diagnosis Date  . Kidney stones   . Kidney stone     There are no active problems to display for this patient.   History reviewed. No pertinent past surgical history.  Current Outpatient Rx  Name  Route  Sig  Dispense  Refill  . losartan (COZAAR) 25 MG tablet   Oral   Take 25 mg by mouth daily.      11   . omeprazole (PRILOSEC) 20 MG capsule   Oral   Take 20 mg by mouth daily as needed.      11   . ondansetron (ZOFRAN ODT) 4 MG disintegrating tablet   Oral   Take 1 tablet (4 mg total) by mouth every 6 (six) hours as needed for nausea or vomiting.   20 tablet   0   . oxyCODONE-acetaminophen (PERCOCET) 10-325 MG tablet   Oral   Take 1 tablet by mouth every 6 (six) hours as needed for pain.   20 tablet   0   . oxyCODONE-acetaminophen (ROXICET) 5-325 MG per tablet   Oral   Take 1 tablet by mouth every 6 (six) hours as needed for severe pain.   20 tablet   0   . polyethylene glycol (MIRALAX / GLYCOLAX) packet   Oral   Take 17 g by mouth daily.   14 each   0   . tamsulosin (FLOMAX) 0.4 MG CAPS capsule   Oral   Take 1 capsule (0.4 mg total) by mouth daily.   7 capsule   0   . valACYclovir (VALTREX) 500 MG tablet   Oral   Take 500 mg by mouth daily.      11     Allergies Review of patient's allergies indicates no known allergies.  No family history on file.  Social History Social History  Substance Use  Topics  . Smoking status: Current Every Day Smoker -- 1.00 packs/day    Types: Cigarettes  . Smokeless tobacco: None  . Alcohol Use: No    Review of Systems es: No visual changes. ENT: No sore throat. Cardiovascular: Denies chest pain. Respiratory: Denies shortness of breath. Musculoskeletal: Negative for back pain. Skin: Negative for rash. Neurological: Negative for headaches, focal weakness or numbness.  10-point ROS otherwise negative.  ____________________________________________   PHYSICAL EXAM:  VITAL SIGNS: ED Triage Vitals  Enc Vitals Group     BP 04/10/15 1049 146/73 mmHg     Pulse Rate 04/10/15 1049 80     Resp 04/10/15 1049 18     Temp 04/10/15 1049 97.9 F (36.6 C)     Temp src --      SpO2 04/10/15 1049 100 %     Weight 04/10/15 1049 128 lb (58.06 kg)     Height 04/10/15 1049  (1.6 m)     Head Cir --      Peak Flow --      Pain Score 04/10/15 1050 8     Pain  Loc --      Pain Edu? --      Excl. in GC? --     Constitutional: Alert and oriented. Well appearing and in no acute distress. Eyes: Conjunctivae are normal. PERRL. EOMI. Head: Atraumatic. Nose: No congestion/rhinnorhea. Mouth/Throat: Mucous membranes are moist.  Oropharynx non-erythematous. Neck: No stridor. Cardiovascular: Normal rate, regular rhythm. Grossly normal heart sounds.  Good peripheral circulation. Respiratory: Normal respiratory effort.  No retractions. Lungs CTAB. Gastrointestinal: Soft mildly tender to palpation of the left side of the abdomen and suprapubically. There is no tenderness to percussion or rebound. No distention. No abdominal bruits. No CVA tenderness. Musculoskeletal: No lower extremity tenderness nor edema.  No joint effusions. There is some left-sided CVA tenderness Neurologic:  Normal speech and language. No gross focal neurologic deficits are appreciated. No gait instability. Skin:  Skin is warm, dry and intact. No rash  noted.  ____________________________________________   LABS (all labs ordered are listed, but only abnormal results are displayed)  Labs Reviewed  BASIC METABOLIC PANEL - Abnormal; Notable for the following:    Calcium 8.8 (*)    All other components within normal limits  URINALYSIS COMPLETEWITH MICROSCOPIC ([facility] ONLY) - Abnormal; Notable for the following:    Color, Urine STRAW (*)    APPearance CLEAR (*)    Hgb urine dipstick 3+ (*)    Squamous Epithelial / LPF 0-5 (*)    All other components within normal limits  PREGNANCY, URINE  CBC WITH DIFFERENTIAL/PLATELET  POCT PREGNANCY, URINE   ____________________________________________  EKG   ____________________________________________  RADIOLOGY  KUB shows 5-6 mm stone on the left side adjacent to L3 this undoubtedly represents a stone causing the patient's pain patient reports her pain is better at this time ____________________________________________   PROCEDURES    ____________________________________________   INITIAL IMPRESSION / ASSESSMENT AND PLAN / ED COURSE  Pertinent labs & imaging results that were available during my care of the patient were reviewed by me and considered in my medical decision making (see chart for details). On discharge patient doing well pain controlled  ____________________________________________   FINAL CLINICAL IMPRESSION(S) / ED DIAGNOSES  Final diagnoses:  Renal colic      Arnaldo NatalPaul F Tahirah Sara, MD 04/10/15 2149

## 2015-04-10 NOTE — Discharge Instructions (Signed)
Kidney Stones °Kidney stones (urolithiasis) are deposits that form inside your kidneys. The intense pain is caused by the stone moving through the urinary tract. When the stone moves, the ureter goes into spasm around the stone. The stone is usually passed in the urine.  °CAUSES  °· A disorder that makes certain neck glands produce too much parathyroid hormone (primary hyperparathyroidism). °· A buildup of uric acid crystals, similar to gout in your joints. °· Narrowing (stricture) of the ureter. °· A kidney obstruction present at birth (congenital obstruction). °· Previous surgery on the kidney or ureters. °· Numerous kidney infections. °SYMPTOMS  °· Feeling sick to your stomach (nauseous). °· Throwing up (vomiting). °· Blood in the urine (hematuria). °· Pain that usually spreads (radiates) to the groin. °· Frequency or urgency of urination. °DIAGNOSIS  °· Taking a history and physical exam. °· Blood or urine tests. °· CT scan. °· Occasionally, an examination of the inside of the urinary bladder (cystoscopy) is performed. °TREATMENT  °· Observation. °· Increasing your fluid intake. °· Extracorporeal shock wave lithotripsy--This is a noninvasive procedure that uses shock waves to break up kidney stones. °· Surgery may be needed if you have severe pain or persistent obstruction. There are various surgical procedures. Most of the procedures are performed with the use of small instruments. Only small incisions are needed to accommodate these instruments, so recovery time is minimized. °The size, location, and chemical composition are all important variables that will determine the proper choice of action for you. Talk to your health care provider to better understand your situation so that you will minimize the risk of injury to yourself and your kidney.  °HOME CARE INSTRUCTIONS  °· Drink enough water and fluids to keep your urine clear or pale yellow. This will help you to pass the stone or stone fragments. °· Strain  all urine through the provided strainer. Keep all particulate matter and stones for your health care provider to see. The stone causing the pain may be as small as a grain of salt. It is very important to use the strainer each and every time you pass your urine. The collection of your stone will allow your health care provider to analyze it and verify that a stone has actually passed. The stone analysis will often identify what you can do to reduce the incidence of recurrences. °· Only take over-the-counter or prescription medicines for pain, discomfort, or fever as directed by your health care provider. °· Keep all follow-up visits as told by your health care provider. This is important. °· Get follow-up X-rays if required. The absence of pain does not always mean that the stone has passed. It may have only stopped moving. If the urine remains completely obstructed, it can cause loss of kidney function or even complete destruction of the kidney. It is your responsibility to make sure X-rays and follow-ups are completed. Ultrasounds of the kidney can show blockages and the status of the kidney. Ultrasounds are not associated with any radiation and can be performed easily in a matter of minutes. °· Make changes to your daily diet as told by your health care provider. You may be told to: °¨ Limit the amount of salt that you eat. °¨ Eat 5 or more servings of fruits and vegetables each day. °¨ Limit the amount of meat, poultry, fish, and eggs that you eat. °· Collect a 24-hour urine sample as told by your health care provider. You may need to collect another urine sample every 6-12   months. SEEK MEDICAL CARE IF:  You experience pain that is progressive and unresponsive to any pain medicine you have been prescribed. SEEK IMMEDIATE MEDICAL CARE IF:   Pain cannot be controlled with the prescribed medicine.  You have a fever or shaking chills.  The severity or intensity of pain increases over 18 hours and is not  relieved by pain medicine.  You develop a new onset of abdominal pain.  You feel faint or pass out.  You are unable to urinate.   This information is not intended to replace advice given to you by your health care provider. Make sure you discuss any questions you have with your health care provider.   Document Released: 04/20/2005 Document Revised: 01/09/2015 Document Reviewed: 09/21/2012 Elsevier Interactive Patient Education [employer]  I also had [facility] to [facility] with Dr. Rosalia Hammersay nor Robert BellowAYNO Waller at 9 AM on December 22 they want you to bring her insurance card and a photo ID or don't have the insurance card than $100 in cash. Their phone number is [phone].   Please return here for you can go [facility] the [facility] if you have worse pain fever vomiting or feell sicker.

## 2016-09-10 IMAGING — CT CT RENAL STONE PROTOCOL
1 of 2 series · 4 of 32 positions shown, 9 images · non-contrast
Comparison: CT abdomen pelvis 09/17/2011

CLINICAL DATA: Patient with left flank pain radiating to the lower
abdomen for 2 days. Prior history of renal stones with lithotripsy.

EXAM:
CT ABDOMEN AND PELVIS WITHOUT CONTRAST
TECHNIQUE: Multidetector CT imaging of the abdomen and pelvis was performed
following the standard protocol without IV contrast.

[Series 4: lung windows · axial · 0.68mm/px · z∈[+543,+603]mm · 4 of 22 slices shown, 9 images]
[im 5/22  soft-tissue]
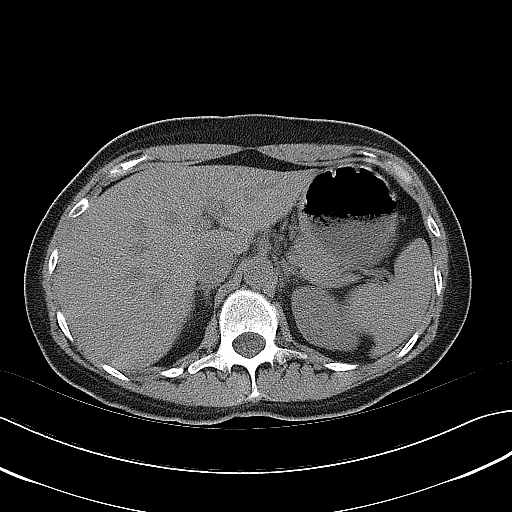
[im 5/22  lung]
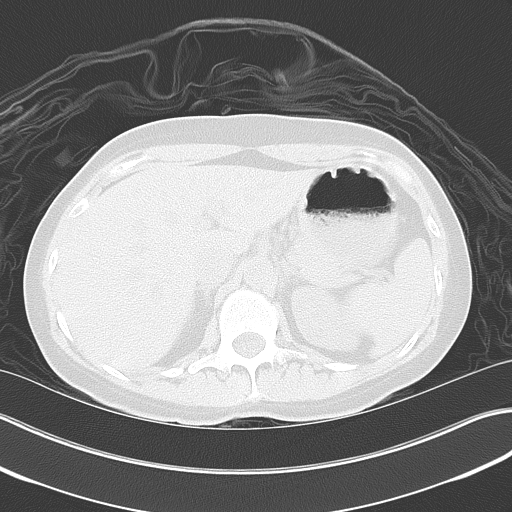
[im 5/22  bone]
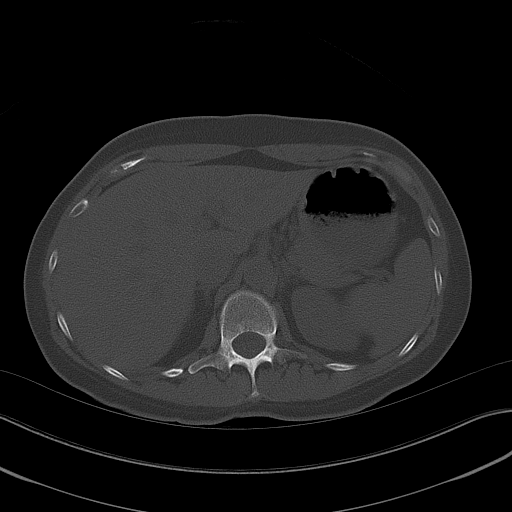
[im 9/22  soft-tissue]
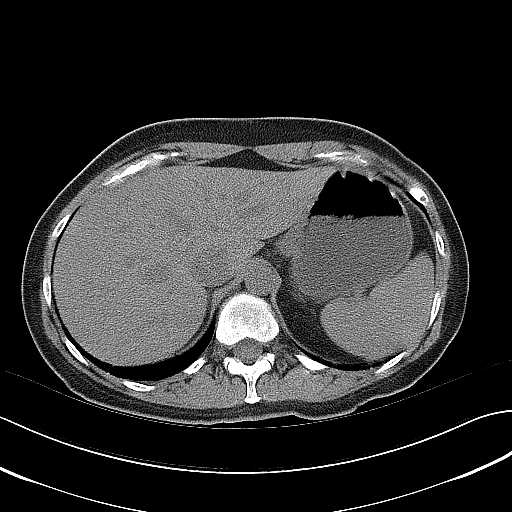
[im 9/22  lung]
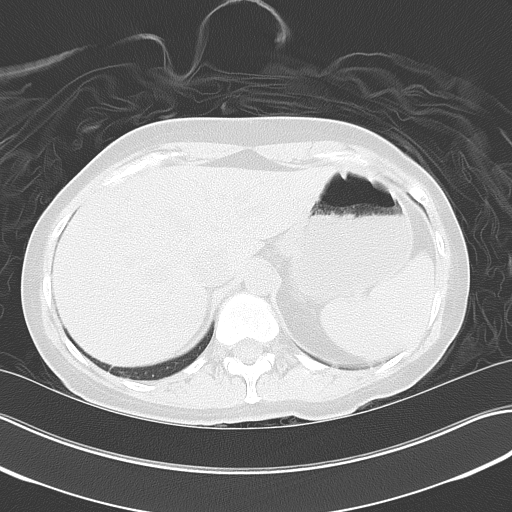
[im 13/22  soft-tissue]
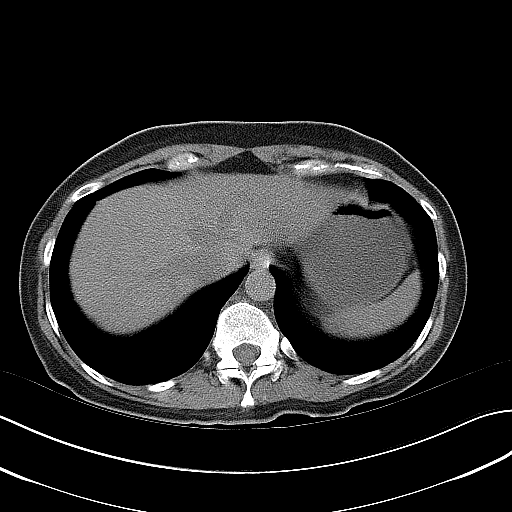
[im 13/22  lung]
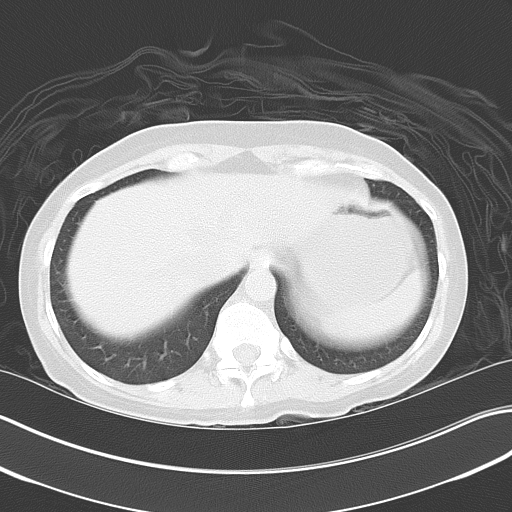
[im 17/22  soft-tissue]
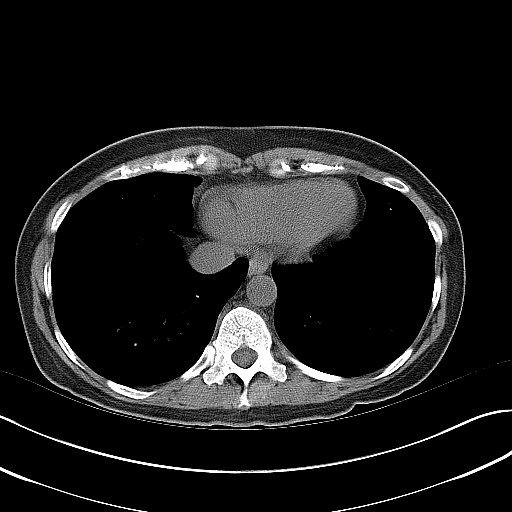
[im 17/22  lung]
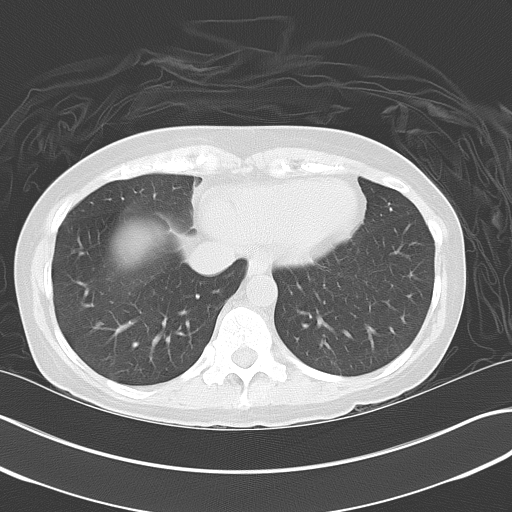

[4 of 32 positions shown; findings below may reference images not displayed]

FINDINGS: Lower chest:  Lung bases are clear.  Normal heart size.

Hepatobiliary: Liver is normal in size and contour. Gallbladder is
unremarkable. No intrahepatic or extrahepatic biliary ductal
dilatation.

Pancreas: Unremarkable

Spleen: Unremarkable

Adrenals/Urinary Tract: Normal adrenal glands. There is moderate
left hydroureteronephrosis to the level of the mid ureter were there
is an obstructing 6 mm stone (image 56; series 2). Urinary bladder
is unremarkable. Right kidney is lobular in contour. Suggestion of
cyst within the interpolar region and superior pole of right kidney.
Bilateral nephrolithiasis. Additionally findings are most suggestive
of bilateral medullary nephrocalcinosis.

Stomach/Bowel: No abnormal bowel wall thickening or evidence for
bowel obstruction. No free fluid or free intraperitoneal air.

Vascular/Lymphatic: Normal caliber abdominal aorta. No
retroperitoneal lymphadenopathy.

Other: Uterus and adnexal structures are unremarkable.

Musculoskeletal: No aggressive or acute appearing osseous lesions.
IMPRESSION: Obstructing 6 mm stone within the mid left ureter.

Additional bilateral nephrolithiasis. Findings suggestive of
medullary nephrocalcinosis.

## 2019-05-18 ENCOUNTER — Ambulatory Visit: Payer: No Typology Code available for payment source | Attending: Internal Medicine

## 2019-05-18 DIAGNOSIS — Z20822 Contact with and (suspected) exposure to covid-19: Secondary | ICD-10-CM

## 2019-05-19 LAB — NOVEL CORONAVIRUS, NAA: SARS-CoV-2, NAA: NOT DETECTED

## 2019-09-07 ENCOUNTER — Emergency Department
Admission: EM | Admit: 2019-09-07 | Discharge: 2019-09-07 | Disposition: A | Discharge status: Home / Self Care | Payer: No Typology Code available for payment source | Attending: Emergency Medicine | Admitting: Emergency Medicine

## 2019-09-07 ENCOUNTER — Other Ambulatory Visit: Payer: Self-pay

## 2019-09-07 DIAGNOSIS — R3129 Other microscopic hematuria: Secondary | ICD-10-CM | POA: Diagnosis not present

## 2019-09-07 DIAGNOSIS — Z79899 Other long term (current) drug therapy: Secondary | ICD-10-CM | POA: Diagnosis not present

## 2019-09-07 DIAGNOSIS — R109 Unspecified abdominal pain: Secondary | ICD-10-CM | POA: Diagnosis not present

## 2019-09-07 DIAGNOSIS — N201 Calculus of ureter: Secondary | ICD-10-CM | POA: Insufficient documentation

## 2019-09-07 DIAGNOSIS — F17210 Nicotine dependence, cigarettes, uncomplicated: Secondary | ICD-10-CM | POA: Insufficient documentation

## 2019-09-07 LAB — CBC
HCT: 34.6 % — ABNORMAL LOW (ref 36.0–46.0)
Hemoglobin: 10.9 g/dL — ABNORMAL LOW (ref 12.0–15.0)
MCH: 26.4 pg (ref 26.0–34.0)
MCHC: 31.5 g/dL (ref 30.0–36.0)
MCV: 83.8 fL (ref 80.0–100.0)
Platelets: 285 10*3/uL (ref 150–400)
RBC: 4.13 MIL/uL (ref 3.87–5.11)
RDW: 14.6 % (ref 11.5–15.5)
WBC: 5.6 10*3/uL (ref 4.0–10.5)
nRBC: 0 % (ref 0.0–0.2)

## 2019-09-07 LAB — URINALYSIS, COMPLETE (UACMP) WITH MICROSCOPIC
Bilirubin Urine: NEGATIVE
Glucose, UA: NEGATIVE mg/dL
Ketones, ur: NEGATIVE mg/dL
Leukocytes,Ua: NEGATIVE
Nitrite: NEGATIVE
Protein, ur: NEGATIVE mg/dL
RBC / HPF: >50 RBC/hpf — ABNORMAL HIGH (ref 0–5)
Specific Gravity, Urine: 1.005 (ref 1.005–1.030)
Squamous Epithelial / LPF: NONE SEEN (ref 0–5)
pH: 6 (ref 5.0–8.0)

## 2019-09-07 LAB — BASIC METABOLIC PANEL
Anion gap: 10 (ref 5–15)
BUN: 16 mg/dL (ref 6–20)
CO2: 24 mmol/L (ref 22–32)
Calcium: 9.7 mg/dL (ref 8.9–10.3)
Chloride: 106 mmol/L (ref 98–111)
Creatinine, Ser: 0.71 mg/dL (ref 0.44–1.00)
GFR calc Af Amer: >60 mL/min (ref >60)
GFR calc non Af Amer: >60 mL/min (ref >60)
Glucose, Bld: 89 mg/dL (ref 70–99)
Potassium: 3.8 mmol/L (ref 3.5–5.1)
Sodium: 140 mmol/L (ref 135–145)

## 2019-09-07 LAB — POCT PREGNANCY, URINE: Preg Test, Ur: NEGATIVE

## 2019-09-07 MED ORDER — TAMSULOSIN HCL 0.4 MG PO CAPS: 0.4000 mg | ORAL_CAPSULE | Freq: Every day | ORAL | 0 refills | Status: AC

## 2019-09-07 MED ORDER — KETOROLAC TROMETHAMINE 10 MG PO TABS
10.0000 mg | ORAL_TABLET | Freq: Once | ORAL | Status: AC
  Administered 2019-09-07: 10 mg via ORAL
  Filled 2019-09-07: qty 1

## 2019-09-07 MED ORDER — OXYCODONE-ACETAMINOPHEN 5-325 MG PO TABS
1.0000 | ORAL_TABLET | Freq: Once | ORAL | Status: AC
  Administered 2019-09-07: 1 via ORAL
  Filled 2019-09-07: qty 1

## 2019-09-07 MED ORDER — KETOROLAC TROMETHAMINE 10 MG PO TABS: 10.0000 mg | ORAL_TABLET | Freq: Four times a day (QID) | ORAL | 0 refills | Status: DC | PRN

## 2019-09-07 MED ORDER — OXYCODONE-ACETAMINOPHEN 5-325 MG PO TABS: 1.0000 | ORAL_TABLET | Freq: Four times a day (QID) | ORAL | 0 refills | Status: AC | PRN

## 2019-09-07 NOTE — ED Provider Notes (Signed)
[facility] Emergency Department Provider Note  ____________________________________________  Time seen: Approximately 9:36 PM  I have reviewed the triage vital signs and the nursing notes.   HISTORY  Chief Complaint Flank Pain    HPI Amber Mcbride is a 44 y.o. female with a history of kidney stones who comes ED complaining of left flank pain radiating to left lower quadrant starting yesterday.  Constant, waxing waning, no aggravating alleviating factors, severe, feels like prior kidney stones.  Electronic medical record reviewed showing CT abdomen pelvis performed at [facility] in March 2021 showing multiple stones in bilateral kidneys, all small enough to pass.      Past Medical History:  Diagnosis Date  . Kidney stone   . Kidney stones      There are no problems to display for this patient.    History reviewed. No pertinent surgical history.   Prior to Admission medications   Medication Sig Start Date End Date Taking? Authorizing Provider  ketorolac (TORADOL) 10 MG tablet Take 1 tablet (10 mg total) by mouth every 6 (six) hours as needed for moderate pain. 09/07/19   Sharman Cheek, MD  losartan (COZAAR) 25 MG tablet Take 25 mg by mouth daily. 10/19/14   [provider]  omeprazole (PRILOSEC) 20 MG capsule Take 20 mg by mouth daily as needed. 10/19/14   [provider]  ondansetron (ZOFRAN ODT) 4 MG disintegrating tablet Take 1 tablet (4 mg total) by mouth every 6 (six) hours as needed for nausea or vomiting. 11/10/14   Sharyn Creamer, MD  oxyCODONE-acetaminophen (PERCOCET) 5-325 MG tablet Take 1 tablet by mouth every 6 (six) hours as needed for severe pain. 09/07/19 09/06/20  Sharman Cheek, MD  oxyCODONE-acetaminophen (ROXICET) 5-325 MG per tablet Take 1 tablet by mouth every 6 (six) hours as needed for severe pain. 11/10/14   Sharyn Creamer, MD  polyethylene glycol (MIRALAX / GLYCOLAX) packet Take 17 g by mouth daily. 11/15/14   Emily Filbert, MD  tamsulosin (FLOMAX) 0.4 MG CAPS capsule Take 1 capsule (0.4 mg total) by mouth daily for 10 days. Discontinue after symptoms improve 09/07/19 09/17/19  Sharman Cheek, MD  valACYclovir (VALTREX) 500 MG tablet Take 500 mg by mouth daily. 08/24/14   [provider]     Allergies Patient has no known allergies.   No family history on file.  Social History Social History   Tobacco Use  . Smoking status: Current Every Day Smoker    Packs/day: 1.00    Types: Cigarettes  . Smokeless tobacco: Never Used  Substance Use Topics  . Alcohol use: No  . Drug use: Not on file    Review of Systems  Constitutional:   No fever or chills.  ENT:   No sore throat. No rhinorrhea. Cardiovascular:   No chest pain or syncope. Respiratory:   No dyspnea or cough. Gastrointestinal: Left leg pain as above without vomiting.  Tolerating oral intake..  Musculoskeletal:   Negative for focal pain or swelling All other systems reviewed and are negative except as documented above in ROS and HPI.  ____________________________________________   PHYSICAL EXAM:  VITAL SIGNS: ED Triage Vitals  Enc Vitals Group     BP 09/07/19 1715 (!) 145/86     Pulse Rate 09/07/19 1715 71     Resp 09/07/19 1715 18     Temp 09/07/19 1715 97.7 F (36.5 C)     Temp src --      SpO2 09/07/19 1715 100 %  Weight 09/07/19 1717 140 lb (63.5 kg)     Height 09/07/19 1717 5\' 2"  (1.575 m)     Head Circumference --      Peak Flow --      Pain Score 09/07/19 1712 9     Pain Loc --      Pain Edu? --      Excl. in [place]? --     Vital signs reviewed, nursing assessments reviewed.   Constitutional:   Alert and oriented. Non-toxic appearance. Eyes:   Conjunctivae are normal. EOMI. PERRL. ENT      Head:   Normocephalic and atraumatic.      Nose:   Wearing a mask.      Mouth/Throat:   Wearing a mask.      Neck:   No meningismus. Full ROM. Hematological/Lymphatic/Immunilogical:   No cervical  lymphadenopathy. Cardiovascular:   RRR. Symmetric bilateral radial and DP pulses.  No murmurs. Cap refill less than 2 seconds. Respiratory:   Normal respiratory effort without tachypnea/retractions. Breath sounds are clear and equal bilaterally. No wheezes/rales/rhonchi. Gastrointestinal:   Soft and nontender. Non distended. There is no CVA tenderness.  No rebound, rigidity, or guarding.  Musculoskeletal:   Normal range of motion in all extremities. No joint effusions.  No lower extremity tenderness.  No edema. Neurologic:   Normal speech and language.  Motor grossly intact. No acute focal neurologic deficits are appreciated.  Skin:    Skin is warm, dry and intact. No rash noted.  No petechiae, purpura, or bullae.  ____________________________________________    LABS (pertinent positives/negatives) (all labs ordered are listed, but only abnormal results are displayed) Labs Reviewed  URINALYSIS, COMPLETE (UACMP) WITH MICROSCOPIC - Abnormal; Notable for the following components:      Result Value   Color, Urine YELLOW (*)    APPearance CLEAR (*)    Hgb urine dipstick LARGE (*)    RBC / HPF >50 (*)    Bacteria, UA RARE (*)    All other components within normal limits  CBC - Abnormal; Notable for the following components:   Hemoglobin 10.9 (*)    HCT 34.6 (*)    All other components within normal limits  BASIC METABOLIC PANEL  POC URINE PREG, ED  POCT PREGNANCY, URINE   ____________________________________________   EKG    ____________________________________________    RADIOLOGY  No results found.  ____________________________________________   PROCEDURES Procedures  ____________________________________________    CLINICAL IMPRESSION / ASSESSMENT AND PLAN / ED COURSE  Medications ordered in the ED: Medications  ketorolac (TORADOL) tablet 10 mg (has no administration in time range)  oxyCODONE-acetaminophen (PERCOCET/ROXICET) 5-325 MG per tablet 1 tablet (has  no administration in time range)    Pertinent labs & imaging results that were available during my care of the patient were reviewed by me and considered in my medical decision making (see chart for details).  Amber Mcbride was evaluated in Emergency Department on 09/07/2019 for the symptoms described in the history of present illness. She was evaluated in the context of the global COVID-19 pandemic, which necessitated consideration that the patient might be at risk for infection with the SARS-CoV-2 virus that causes COVID-19. Institutional protocols and algorithms that pertain to the evaluation of patients at risk for COVID-19 are in a state of rapid change based on information released by regulatory bodies including the CDC and federal and state organizations. These policies and algorithms were followed during the patient's care in the ED.   Patient is  nontoxic, presents with symptoms consistent with renal colic which is not new for her.  No evidence of renal insufficiency or UTI.  Abdominal exam is benign. Doubt ovarian torsion.  Vital signs normal, will start on Toradol and Percocet and Flomax, follow-up with urology.      ____________________________________________   FINAL CLINICAL IMPRESSION(S) / ED DIAGNOSES    Final diagnoses:  Left flank pain  Other microscopic hematuria  Ureterolithiasis     ED Discharge Orders         Ordered    oxyCODONE-acetaminophen (PERCOCET) 5-325 MG tablet  Every 6 hours PRN     09/07/19 2132    ketorolac (TORADOL) 10 MG tablet  Every 6 hours PRN     09/07/19 2132    tamsulosin (FLOMAX) 0.4 MG CAPS capsule  Daily     09/07/19 2132          Portions of this note were generated with dragon dictation software. Dictation errors may occur despite best attempts at proofreading.   Sharman Cheek, MD 09/07/19 2138

## 2019-09-07 NOTE — ED Notes (Signed)
Pt provided with urine cup

## 2019-09-07 NOTE — ED Triage Notes (Signed)
Pt comes via POV from home with c/o left flank pain that started two days ago. Pt states it is now hurting on the right too. Pt states she knows it is a kidney stone.

## 2019-09-07 NOTE — ED Notes (Signed)
Pt requesting to avoid IV placement at this time until seen by the EDP. Pt states she "would rather just get a prescription" and avoid an IV, if possible. Pt made aware that an IV may be necessary at a later time (depending on EDP discretion and potential need for further blood work/imaging) with pt verbalizing understanding.

## 2021-07-29 ENCOUNTER — Telehealth: Payer: Self-pay | Admitting: Family Medicine

## 2021-12-30 ENCOUNTER — Ambulatory Visit (INDEPENDENT_AMBULATORY_CARE_PROVIDER_SITE_OTHER): Payer: No Typology Code available for payment source | Admitting: Podiatry

## 2021-12-30 DIAGNOSIS — Z79899 Other long term (current) drug therapy: Secondary | ICD-10-CM | POA: Diagnosis not present

## 2021-12-30 DIAGNOSIS — B351 Tinea unguium: Secondary | ICD-10-CM

## 2022-01-01 ENCOUNTER — Encounter: Payer: Self-pay | Admitting: Podiatry

## 2022-01-01 NOTE — Progress Notes (Signed)
Subjective:  Patient ID: Amber Mcbride, female    DOB: [birth year],  MRN: [id number]  Chief Complaint  Patient presents with   Nail Problem    Nail fungus     46 y.o. female presents with the above complaint.  Patient presents with complaint of right hallux nail fungus.  Is thickened elongated dystrophic mycotic toenails x1.  She would like to discuss treatment options for it.  She has not seen anyone else prior to seeing me.  Does not cause her pain.  She has not tried some over-the-counter stuff none of which has helped.  She would like to discuss with oral medication   Review of Systems: Negative except as noted in the HPI. Denies N/V/F/Ch.  Past Medical History:  Diagnosis Date   Kidney stone    Kidney stones     Current Outpatient Medications:    ketorolac (TORADOL) 10 MG tablet, Take 1 tablet (10 mg total) by mouth every 6 (six) hours as needed for moderate pain., Disp: 12 tablet, Rfl: 0   losartan (COZAAR) 25 MG tablet, Take 25 mg by mouth daily., Disp: , Rfl: 11   omeprazole (PRILOSEC) 20 MG capsule, Take 20 mg by mouth daily as needed., Disp: , Rfl: 11   ondansetron (ZOFRAN ODT) 4 MG disintegrating tablet, Take 1 tablet (4 mg total) by mouth every 6 (six) hours as needed for nausea or vomiting., Disp: 20 tablet, Rfl: 0   oxyCODONE-acetaminophen (ROXICET) 5-325 MG per tablet, Take 1 tablet by mouth every 6 (six) hours as needed for severe pain., Disp: 20 tablet, Rfl: 0   polyethylene glycol (MIRALAX / GLYCOLAX) packet, Take 17 g by mouth daily., Disp: 14 each, Rfl: 0   valACYclovir (VALTREX) 500 MG tablet, Take 500 mg by mouth daily., Disp: , Rfl: 11  Social History   Tobacco Use  Smoking Status Every Day   Packs/day: 1.00   Types: Cigarettes  Smokeless Tobacco Never    No Known Allergies Objective:  There were no vitals filed for this visit. There is no height or weight on file to calculate BMI. Constitutional Well developed. Well nourished.  Vascular Dorsalis  pedis pulses palpable bilaterally. Posterior tibial pulses palpable bilaterally. Capillary refill normal to all digits.  No cyanosis or clubbing noted. Pedal hair growth normal.  Neurologic Normal speech. Oriented to person, place, and time. Epicritic sensation to light touch grossly present bilaterally.  Dermatologic Nails thickened elongated dystrophic mycotic toenails x1 right hallux Skin within normal limits  Orthopedic: Normal joint ROM without pain or crepitus bilaterally. No visible deformities. No bony tenderness.   Radiographs: None Assessment:   1. Long-term use of high-risk medication   2. Nail fungus   3. Onychomycosis due to dermatophyte    Plan:  Patient was evaluated and treated and all questions answered.  Right hallux onychomycosis -Educated the patient on the etiology of onychomycosis and various treatment options associated with improving the fungal load.  I explained to the patient that there is 3 treatment options available to treat the onychomycosis including topical, p.o., laser treatment.  Patient elected to undergo p.o. options with Lamisil/terbinafine therapy.  In order for me to start the medication therapy, I explained to the patient the importance of evaluating the liver and obtaining the liver function test.  Once the liver function test comes back normal I will start him on 16-month course of Lamisil therapy.  Patient understood all risk and would like to proceed with Lamisil therapy.  I have asked the  patient to immediately stop the Lamisil therapy if she has any reactions to it and call the office or go to the emergency room right away.  Patient states understanding   No follow-ups on file.

## 2022-03-11 LAB — HEPATIC FUNCTION PANEL
ALT: 34 IU/L — ABNORMAL HIGH (ref 0–32)
AST: 32 IU/L (ref 0–40)
Albumin: 4.5 g/dL (ref 3.9–4.9)
Alkaline Phosphatase: 83 IU/L (ref 44–121)
Bilirubin Total: 0.2 mg/dL (ref 0.0–1.2)
Bilirubin, Direct: <0.1 mg/dL (ref 0.00–0.40)
Total Protein: 6.9 g/dL (ref 6.0–8.5)

## 2022-03-11 MED ORDER — TERBINAFINE HCL 250 MG PO TABS: 250.0000 mg | ORAL_TABLET | Freq: Every day | ORAL | 0 refills | Status: DC

## 2022-03-11 NOTE — Addendum Note (Signed)
Addended by: Nicholes Rough on: 03/11/2022 09:41 AM   Modules accepted: Orders

## 2022-11-11 ENCOUNTER — Telehealth: Payer: Self-pay

## 2022-11-11 NOTE — Telephone Encounter (Signed)
Bufford Buttner calling from [facility] in [place].  Needs pt's mammogram images from about seven years ago; she is faxing a ROI and her number and fax number is on the bottom of the fax.  If we have images please send CD.

## 2022-11-20 NOTE — Telephone Encounter (Signed)
11/17/22- Fax request/authorization received. Images burned to disk and mailed with copy of report that was also faxed to [facility] Fax #[phone] Phone#[phone]

## 2023-03-29 NOTE — Discharge Summary (Signed)
 [facility] Discharge Summary  Admit date: 03/27/2023  Discharge date and time: 03/29/23    Discharge to:  Home  Discharge Service: Surg Urology (SRU)  Discharge Attending Physician: Donnice Dallas Scrivener, MD  Discharge  Diagnoses: Left 10mm Distal ureteral stone Nephrolithiasis  Secondary Diagnosis: None  OR Procedures: Left - CYSTOURETHROSCOPY, WITH URETEROSCOPY AND/OR PYELOSCOPY; WITH LITHOTRIPSY INCLUDING INSERTION OF INDWELLING URETERAL STENT 03/29/2023   Ancillary Procedures: no procedures  Condition at Discharge: good Patient is back to their functional baseline and is self-managing all activites of daily living.   Discharge Day Services: The patient was seen and examined by the Urology team both in the morning and immediately prior to discharge.  Vital signs and laboratory values were stable and within normal limits. The physical exam was benign and unchanged and all surgical wounds were examined. Discharge instructions were explained and all questions answered.  Subjective  Pain Controlled. No fever or chills.  Objective Patient Vitals for the past 8 hrs:  BP Temp Temp src Pulse Resp SpO2  03/29/23 1228 135/81 35.8 C (96.4 F) Temporal 63 16 99 %   I/O this shift: In: 451.3 [I.V.:351.3; IV Piggyback:100] Out: 0   General:  No acute distress Lungs:   Normal work of breathing Cardiac: Regular rate Abdomen: Soft, non tender, non distended GU:  Voiding spontaneously Extremities: Warm and well perfused Neuro:             Alert and oriented, strength and sensation grossly normal   Hospital Course:  HPI: Amber Mcbride is a 47 y.o. female with a PMH of HTN, pyelonephritis, nephrolithiasis. Presented with flank pain and was found to have a 1 cm left ureteral stone with upstream moderate hydroureteronephrosis.  Admitted for refractory pain.   The patient was admitted from the ED with Left sided pain.  A CT confirmed a Left 10mm Distal ureteral stone.  Urinalysis showed no infection. They underwent Left ureteral stent placement on 03/29/2023.  The patient tolerated the procedure well, was extubated in the OR, and afterwards was taken to the PACU for routine post-surgical care. When stable the patient was transferred back to the floor.   The patient did well postoperatively.  Their diet was slowly advanced and at the time of discharge the patient was tolerating a regular diet.  The patient was discharged home on later that day * Day of Surgery *, at which point the patient was tolerating a regular solid diet, was able to void spontaneously, have their pain controlled with P.O. pain medication, and able to ambulate without difficulty.    She will remove her stent at home in 3 days and was sent 1 dose of antibiotics for stent pull.  She will be scheduled for a post-op follow up appointment in 6-8 weeks with renal US  prior.    Body mass index is 24.98 kg/m.                    Condition at Discharge: Improved Discharge Medications:    Your Medication List     START taking these medications    acetaminophen  500 MG tablet Commonly known as: TYLENOL  Take 2 tablets (1,000 mg total) by mouth every six (6) hours.   ciprofloxacin HCl 500 MG tablet Commonly known as: CIPRO Take 1 tablet (500 mg total) by mouth every twelve (12) hours. Take this antibiotic on the day you remove your stent at home   docusate sodium 100 MG capsule Commonly known as: COLACE Take 1  capsule (100 mg total) by mouth daily. Start taking on: March 30, 2023   senna 8.6 mg tablet Commonly known as: SENOKOT Take 2 tablets by mouth nightly.   tamsulosin  0.4 mg capsule Commonly known as: FLOMAX  Take 1 capsule (0.4 mg total) by mouth daily.       CONTINUE taking these medications    fluticasone propionate 50 mcg/actuation nasal spray Commonly known as: FLONASE SHAKE LIQUID AND USE 1 SPRAY IN EACH NOSTRIL DAILY   hydroCHLOROthiazide 12.5 MG  tablet Take 1 tablet (12.5 mg total) by mouth daily.   losartan 50 MG tablet Commonly known as: COZAAR Take 1 tablet (50 mg total) by mouth daily.   metFORMIN 500 MG tablet Commonly known as: GLUCOPHAGE Take 1 tablet (500 mg total) by mouth in the morning.   triamcinolone 0.1 % cream Commonly known as: KENALOG Apply topically two (2) times a day.        Pending Test Results:   Discharge Instructions:   Appointments which have been scheduled for you    Apr 05, 2023 2:30 PM (Arrive by 2:20 PM) PT PELVIC TREATMENT with Tristan Pates, PT [facility] PHYSICAL THERAPY [facility] REGION) [address] [place] [address] [phone]     Apr 12, 2023 2:30 PM (Arrive by 2:20 PM) PT PELVIC TREATMENT with Tristan Pates, PT [facility] PHYSICAL THERAPY [facility] REGION) [address] [place] [address] [phone]     Apr 19, 2023 2:30 PM (Arrive by 2:20 PM) PT PELVIC TREATMENT with Tristan Pates, PT [facility] PHYSICAL THERAPY [facility] REGION) [address] [place] [address] [phone]     Apr 26, 2023 2:30 PM (Arrive by 2:20 PM) PT PELVIC TREATMENT with Tristan Pates, PT [facility] PHYSICAL THERAPY [facility] REGION) [address] [place] [address] [phone]     May 03, 2023 2:30 PM (Arrive by 2:20 PM) PT PELVIC TREATMENT with Tristan Pates, PT [facility] PHYSICAL THERAPY [facility] REGION) [address] [place] [address] [phone]     May 10, 2023 1:45 PM (Arrive by 1:35 PM) PT PELVIC TREATMENT with Tristan Pates, PT [facility] PHYSICAL THERAPY [facility] REGION) [address] [place] [address] [phone]     May 17, 2023 10:00 AM (Arrive by 9:30 AM) US  RENAL COMPLETE with KANDICE US  RM 1 IMG ULTRASOUND [facility]) [address] [place] [address] [phone]     May 17, 2023 11:00 AM (Arrive by 10:30 AM) PRE  OP with Sonny JONETTA Houseman, AGNP [facility] UROLOGY [address] [facility] REGION) [address] [place] [address] [phone]     May 17, 2023 2:30 PM (Arrive by 2:20 PM) PT TREATMENT COMPLEX with Tristan Pates, PT [facility] PHYSICAL THERAPY [facility] REGION) [address] [place] [address] [phone]     Jul 08, 2023 1:20 PM (Arrive by 1:05 PM) NEW UROGYN with Anette Arlean Core, FNP [facility] UROGYN AND RECON PELVIC SURGERY [facility] [place]) [address] [place] [address] [phone]          Follow Up instructions and Outpatient Referrals    Discharge instructions     Discharge instructions      Other Instructions     Discharge instructions     URETERAL STENT DISCHARGE INSTRUCTIONS  What is a Ureteral Stent? You have been discharged  home with a ureteral stent. This is a thin, flexible tube that holds open the ureter, the anatomical tube that drains urine from the kidney to the bladder. This stent is temporary and must be removed or exchanged within three months or else significant damage can occur. Please remove your stent at home in 3 days by gently pulling on the string (in the shower or bath). Please take your antibiotic on the day your pull your stent. Since the stent does not treat the kidney stone, you will have to return for a procedure to have the kidney stone removed.  We have arranged for you to return for a procedure for treatment/removal of your kidney stone. This procedure is called cystoureteroscopy with lithotripsy. You MUST complete a urine culture, which has also been arranged for you, 1-4 weeks prior to your procedure. If this urine culture is not complete prior to your procedure, your procedure will be cancelled. After the procedure, you will follow-up with us  in clinic in 6-8 weeks with a renal ultrasound completed prior to your appointment. We will have you follow up  with us  in 6-8 weeks with a renal ultrasound prior. If you do not hear from us  in the next few business days, please call the clinic at the number below.  You may see some blood in your urine, but this is usually normal. If you have persistent bleeding that is not improving, and/or large amounts of clots in your urine with the inability to urinate, please contact [facility] to discuss your symptoms.  Contact Us  Contact [facility] at the numbers below for fever >101.16F by mouth, uncontrolled nausea or vomiting, pain uncontrolled by medication, decreased urine output, signs of wound infection (rapidly spreading redness/swelling, purulent discharge, increasing bleeding from wounds, or separation of wound); also call for any new or concerning symptoms.  - During normal business hours, call Urology clinic at [phone] - After hours and on weekends, please call [phone] and ask for the the urology resident on call. Please be patient and know that this resident is responsible for answering all hospital and outside calls during this time, and will be back in touch with you as soon as possible. If it is during regular hours, please call the clinic as you will get a quicker response. If you are unable to get in touch with anyone and you feel it is an emergency, proceed to the nearest ER or call an ambulance. - General questions about appointments: [phone]  At Home Resume your regular diet.  Slowly resume activity as tolerated. Walk and be active as much as possible to prevent the formation of blood clots. It is okay to climb stairs if you were physically able to do so before your operation. Resume home medications as prescribed by your primary care physician.  Pain Management Stents cause most patients to have side effects which may include urinary urgency, frequency, and/or pain with urination with strenuous activity. In a female, they can also cause problems with erection. You will receive  several medicines to help with these symptoms, for example, oxybutynin (Ditropan) and tamsulosin  (Flomax ). Oxybutynin helps quiet the bladder, but it may cause constipation or dry mouth. Please use over-the-counter laxatives or stool softeners to help with the former, and sucking on a sugar-free lozenge can help with dry mouth. Tamsulosin  is usually taken at night before going to bed, and it helps with stent discomfort.   For pain management after stone extraction surgery, we recommend the use of NSAIDs (ibuprofen,  Motrin, etc) and Tylenol , taken as directed per the medication label. You will also be given a small number of narcotic pain pills to take ONLY when your pain symptoms are not controlled with the above non-narcotic medications. Please only take the narcotic pain medication when absolutely necessary, as there has been increasing concern regarding the development of dependence on narcotic pain medications in kidney stone patients. Additionally, narcotic pain medications have a number of side effects including constipation, confusion, and somnolence.   Do not drive while taking narcotic pain medications. Patient should avoid driving or operating machinery, activities that require balance or risk of fall, making important decisions or signing legal paperwork. Effects may last for several hours, supervision and assistance at home is recommended. Take Colace and/or Senna while taking narcotic pain medication. You may take over-the-counter Laxatives such as Miralax , Senna, or Milk of Magnesia. Stop taking these medications if you develop diarrhea.   Discharge instructions        I was immediately available via phone/pager or present on site. I reviewed and discussed the discharge summary with the resident, but did not see the patient.  I agree with the assessment and plan as documented in the resident's note. Donnice FORBES Scrivener, MD

## 2024-03-07 ENCOUNTER — Other Ambulatory Visit: Payer: Self-pay

## 2024-03-07 ENCOUNTER — Encounter: Payer: Self-pay | Admitting: Emergency Medicine

## 2024-03-07 ENCOUNTER — Inpatient Hospital Stay
Admission: EM | Admit: 2024-03-07 | Discharge: 2024-03-10 | DRG: 854 | Disposition: A | Discharge status: Home / Self Care | Payer: Self-pay | Attending: Student | Admitting: Student

## 2024-03-07 DIAGNOSIS — N139 Obstructive and reflux uropathy, unspecified: Secondary | ICD-10-CM | POA: Diagnosis present

## 2024-03-07 DIAGNOSIS — A4151 Sepsis due to Escherichia coli [E. coli]: Principal | ICD-10-CM | POA: Diagnosis present

## 2024-03-07 DIAGNOSIS — F17210 Nicotine dependence, cigarettes, uncomplicated: Secondary | ICD-10-CM | POA: Diagnosis present

## 2024-03-07 DIAGNOSIS — Z8249 Family history of ischemic heart disease and other diseases of the circulatory system: Secondary | ICD-10-CM

## 2024-03-07 DIAGNOSIS — R10A2 Flank pain, left side: Secondary | ICD-10-CM

## 2024-03-07 DIAGNOSIS — E162 Hypoglycemia, unspecified: Secondary | ICD-10-CM | POA: Diagnosis not present

## 2024-03-07 DIAGNOSIS — Z833 Family history of diabetes mellitus: Secondary | ICD-10-CM

## 2024-03-07 DIAGNOSIS — N390 Urinary tract infection, site not specified: Principal | ICD-10-CM

## 2024-03-07 DIAGNOSIS — N8110 Cystocele, unspecified: Secondary | ICD-10-CM | POA: Diagnosis present

## 2024-03-07 DIAGNOSIS — R739 Hyperglycemia, unspecified: Secondary | ICD-10-CM | POA: Diagnosis present

## 2024-03-07 DIAGNOSIS — K219 Gastro-esophageal reflux disease without esophagitis: Secondary | ICD-10-CM | POA: Diagnosis present

## 2024-03-07 DIAGNOSIS — Z79899 Other long term (current) drug therapy: Secondary | ICD-10-CM

## 2024-03-07 DIAGNOSIS — A4150 Gram-negative sepsis, unspecified: Secondary | ICD-10-CM

## 2024-03-07 DIAGNOSIS — N132 Hydronephrosis with renal and ureteral calculous obstruction: Secondary | ICD-10-CM

## 2024-03-07 DIAGNOSIS — N136 Pyonephrosis: Secondary | ICD-10-CM | POA: Diagnosis present

## 2024-03-07 DIAGNOSIS — E119 Type 2 diabetes mellitus without complications: Secondary | ICD-10-CM

## 2024-03-07 DIAGNOSIS — Z7984 Long term (current) use of oral hypoglycemic drugs: Secondary | ICD-10-CM

## 2024-03-07 DIAGNOSIS — I10 Essential (primary) hypertension: Secondary | ICD-10-CM | POA: Diagnosis present

## 2024-03-07 LAB — URINALYSIS, ROUTINE W REFLEX MICROSCOPIC
Bilirubin Urine: NEGATIVE
Glucose, UA: NEGATIVE mg/dL
Ketones, ur: NEGATIVE mg/dL
Nitrite: POSITIVE — AB
Protein, ur: 100 mg/dL — AB
Specific Gravity, Urine: 1.016 (ref 1.005–1.030)
WBC, UA: >50 WBC/hpf (ref 0–5)
pH: 7 (ref 5.0–8.0)

## 2024-03-07 LAB — CBC WITH DIFFERENTIAL/PLATELET
Abs Immature Granulocytes: 0.02 K/uL (ref 0.00–0.07)
Basophils Absolute: 0 K/uL (ref 0.0–0.1)
Basophils Relative: 1 %
Eosinophils Absolute: 0.4 K/uL (ref 0.0–0.5)
Eosinophils Relative: 4 %
HCT: 36.8 % (ref 36.0–46.0)
Hemoglobin: 12.5 g/dL (ref 12.0–15.0)
Immature Granulocytes: 0 %
Lymphocytes Relative: 15 %
Lymphs Abs: 1.4 K/uL (ref 0.7–4.0)
MCH: 32.3 pg (ref 26.0–34.0)
MCHC: 34 g/dL (ref 30.0–36.0)
MCV: 95.1 fL (ref 80.0–100.0)
Monocytes Absolute: 0.4 K/uL (ref 0.1–1.0)
Monocytes Relative: 4 %
Neutro Abs: 6.8 K/uL (ref 1.7–7.7)
Neutrophils Relative %: 76 %
Platelets: 226 K/uL (ref 150–400)
RBC: 3.87 MIL/uL (ref 3.87–5.11)
RDW: 11.8 % (ref 11.5–15.5)
WBC: 8.9 K/uL (ref 4.0–10.5)
nRBC: 0 % (ref 0.0–0.2)

## 2024-03-07 LAB — BASIC METABOLIC PANEL WITH GFR
Anion gap: 9 (ref 5–15)
BUN: 25 mg/dL — ABNORMAL HIGH (ref 6–20)
CO2: 26 mmol/L (ref 22–32)
Calcium: 8.9 mg/dL (ref 8.9–10.3)
Chloride: 107 mmol/L (ref 98–111)
Creatinine, Ser: 1.03 mg/dL — ABNORMAL HIGH (ref 0.44–1.00)
GFR, Estimated: >60 mL/min (ref >60)
Glucose, Bld: 121 mg/dL — ABNORMAL HIGH (ref 70–99)
Potassium: 3.8 mmol/L (ref 3.5–5.1)
Sodium: 142 mmol/L (ref 135–145)

## 2024-03-07 LAB — POC URINE PREG, ED: Preg Test, Ur: NEGATIVE

## 2024-03-07 LAB — LACTIC ACID, PLASMA: Lactic Acid, Venous: 1.1 mmol/L (ref 0.5–1.9)

## 2024-03-07 MED ORDER — ONDANSETRON HCL 4 MG/2ML IJ SOLN
4.0000 mg | INTRAMUSCULAR | Status: AC
  Administered 2024-03-07: 4 mg via INTRAVENOUS
  Filled 2024-03-07: qty 2

## 2024-03-07 MED ORDER — KETOROLAC TROMETHAMINE 30 MG/ML IJ SOLN
15.0000 mg | Freq: Once | INTRAMUSCULAR | Status: AC
  Administered 2024-03-07: 15 mg via INTRAVENOUS
  Filled 2024-03-07: qty 1

## 2024-03-07 MED ORDER — LACTATED RINGERS IV BOLUS (SEPSIS)
1000.0000 mL | Freq: Once | INTRAVENOUS | Status: AC
  Administered 2024-03-08: 1000 mL via INTRAVENOUS

## 2024-03-07 MED ORDER — SODIUM CHLORIDE 0.9 % IV SOLN
1.0000 g | INTRAVENOUS | Status: AC
  Administered 2024-03-07: 1 g via INTRAVENOUS
  Filled 2024-03-07: qty 10

## 2024-03-07 MED ORDER — MORPHINE SULFATE (PF) 4 MG/ML IV SOLN
4.0000 mg | Freq: Once | INTRAVENOUS | Status: AC
  Administered 2024-03-07: 4 mg via INTRAVENOUS
  Filled 2024-03-07: qty 1

## 2024-03-07 NOTE — ED Triage Notes (Signed)
 Pt arrives POV ambulatory to triage c/o left flank pain that started around 1700. Pt hx of kidney stones.

## 2024-03-07 NOTE — ED Provider Notes (Signed)
 [facility] Provider Note    Event Date/Time   First MD Initiated Contact with Patient 03/07/24 2305     (approximate)   History   Flank Pain   HPI Amber Mcbride is a 48 y.o. female who has had multiple prior episodes of ureteral stones requiring urological intervention, most recently 1 year ago when she had a left-sided cystourethroscopy with ureteroscopy/pyeloscopy with lithotripsy and indwelling ureteral stent placement.  She presents for evaluation of acute onset left-sided flank pain at 5 PM that has been constant and steadily gotten worse over the last 6 hours.  Feels like similar prior episodes.  Accompanied with nausea but not yet vomiting, severe pain with or without movement.  She also has a little bit of dysuria at the end of her urine stream but has not visualize any blood and was asymptomatic prior to the onset of the flank pain.  She says she does not typically get urinary tract infections and has had no recent increased urinary frequency and no recent fever.     Physical Exam   Triage Vital Signs: ED Triage Vitals  Encounter Vitals Group     BP 03/07/24 2245 98/68     Girls Systolic BP Percentile --      Girls Diastolic BP Percentile --      Boys Systolic BP Percentile --      Boys Diastolic BP Percentile --      Pulse Rate 03/07/24 2245 96     Resp 03/07/24 2245 20     Temp 03/07/24 2245 97.9 F (36.6 C)     Temp Source 03/07/24 2245 Oral     SpO2 03/07/24 2245 99 %     Weight --      Height --      Head Circumference --      Peak Flow --      Pain Score 03/07/24 2243 10     Pain Loc --      Pain Education --      Exclude from Growth Chart --     Most recent vital signs: Vitals:   03/07/24 2245  BP: 98/68  Pulse: 96  Resp: 20  Temp: 97.9 F (36.6 C)  SpO2: 99%    General: Awake,  Alert, appears to be in severe pain, bending over at the waist and rocking back-and-forth. CV:  Good peripheral perfusion.  Resp:  Normal  effort. Speaking easily and comfortably, no accessory muscle usage nor intercostal retractions.   Abd:  No distention.  No tenderness to palpation. Other:  Left flank tenderness to percussion   ED Results / Procedures / Treatments   Labs (all labs ordered are listed, but only abnormal results are displayed) Labs Reviewed  URINALYSIS, ROUTINE W REFLEX MICROSCOPIC - Abnormal; Notable for the following components:      Result Value   Color, Urine YELLOW (*)    APPearance CLOUDY (*)    Hgb urine dipstick MODERATE (*)    Protein, ur 100 (*)    Nitrite POSITIVE (*)    Leukocytes,Ua LARGE (*)    Bacteria, UA RARE (*)    All other components within normal limits  CBC WITH DIFFERENTIAL/PLATELET  BASIC METABOLIC PANEL WITH GFR  POC URINE PREG, ED     RADIOLOGY See ED course for details   PROCEDURES:  Critical Care performed: No  Procedures    IMPRESSION / MDM / ASSESSMENT AND PLAN / ED COURSE  I reviewed the triage  vital signs and the nursing notes.                              Differential diagnosis includes, but is not limited to, renal/ureteral colic with obstruction, UTI/pyonephritis, acute kidney injury, electrolyte or metabolic abnormality.  Patient's presentation is most consistent with acute presentation with potential threat to life or bodily function.  Labs/studies ordered: CBC with differential, BMP, urinalysis, urine pregnancy test, CT renal stone study  Interventions/Medications given:  Medications  ketorolac  (TORADOL ) 30 MG/ML injection 15 mg (15 mg Intravenous Given 03/07/24 2319)  morphine  (PF) 4 MG/ML injection 4 mg (4 mg Intravenous Given 03/07/24 2320)  ondansetron  (ZOFRAN ) injection 4 mg (4 mg Intravenous Given 03/07/24 2318)    (Note:  hospital course my include additional interventions and/or labs/studies not listed above.)   Vitals are stable, blood pressure is on the low side of normal but still appropriate and she has not had much volume loss.   I will give her 1 L of LR as well as morphine  4 mg IV, Zofran  4 mg IV, and Toradol  15 mg IV.  Lab work pending, urine pregnancy Negative, urinalysis pending.  Will order CT scan once patient is more comfortable after medications.   Clinical Course as of 03/08/24 0448  Tue Mar 07, 2024  2322 Urinalysis, Routine w reflex microscopic -Urine, Clean Catch(!) Urinalysis is worrisome for nitrate positive with large leukocytes and greater than 50 WBCs, strongly consistent for UTI.  Given the high probability of obstructive ureteral stone and now with infection, I am treating empirically with ceftriaxone 1 g IV and will also order possible sepsis labs, particularly given the heart rate greater than 90 and borderline blood pressure [CF]  2344 CBC with Differential no leukocytosis [CF]  2356 Lactic Acid, Venous: 1.1 Canceling repeat lactic acid since the first 1 is normal [CF]  Wed Mar 08, 2024  0146 CT ABDOMEN PELVIS W CONTRAST I independently viewed and interpreted the patient's CT scan and I can see a ureteral stone as well as some perinephric stranding on the left.  Radiologist measured at 6 mm and also commented on the stranding.   [CF]  0147 Reassessed patient.  She feels much better than before but can still feel some left-sided flank pain.  Heart rate remains about 95, blood pressure improved.  Given the question of UTI/pyelonephritis and large obstructive stone, I am going to consult Dr. Twylla with urology; I do not believe the patient needs an emergent stent, but I believe she may benefit from hospitalization and IV antibiotics with urgent urology consult in the morning and probable stent placement. [CF]  0151 Consulted by phone with Dr. Twylla who agreed with my plan.  I am now consulting the hospitalist team for admission. [CF]  0155 I am consulting the hospitalist team for admission.   [CF]  0202 I consulted by phone with the admitting hospitalist, and they will admit the patient - Dr. Lawence  [CF]    Clinical Course User Index [CF] Gordan Huxley, MD     FINAL CLINICAL IMPRESSION(S) / ED DIAGNOSES   Final diagnoses:  Urinary tract infection without hematuria, site unspecified  Ureteral stone with hydronephrosis  Left flank pain     Rx / DC Orders   ED Discharge Orders     None        Note:  This document was prepared using Dragon voice recognition software and may include unintentional  dictation errors.   Gordan Huxley, MD 03/08/24 [phone]

## 2024-03-07 NOTE — ED Notes (Signed)
 Pt requesting pain meds. Pt informed a physician will be in to see her and place orders following assessment by them. Emesis bag provided.

## 2024-03-08 ENCOUNTER — Emergency Department: Payer: Self-pay

## 2024-03-08 ENCOUNTER — Inpatient Hospital Stay: Payer: Self-pay | Admitting: Anesthesiology

## 2024-03-08 ENCOUNTER — Inpatient Hospital Stay: Payer: Self-pay

## 2024-03-08 ENCOUNTER — Encounter: Admission: EM | Disposition: A | Payer: Self-pay | Source: Home / Self Care | Attending: Student

## 2024-03-08 DIAGNOSIS — N390 Urinary tract infection, site not specified: Secondary | ICD-10-CM

## 2024-03-08 DIAGNOSIS — E119 Type 2 diabetes mellitus without complications: Secondary | ICD-10-CM

## 2024-03-08 DIAGNOSIS — N136 Pyonephrosis: Secondary | ICD-10-CM

## 2024-03-08 DIAGNOSIS — N139 Obstructive and reflux uropathy, unspecified: Secondary | ICD-10-CM | POA: Diagnosis present

## 2024-03-08 DIAGNOSIS — I10 Essential (primary) hypertension: Secondary | ICD-10-CM | POA: Insufficient documentation

## 2024-03-08 DIAGNOSIS — Z87442 Personal history of urinary calculi: Secondary | ICD-10-CM

## 2024-03-08 DIAGNOSIS — A4150 Gram-negative sepsis, unspecified: Secondary | ICD-10-CM

## 2024-03-08 HISTORY — PX: CYSTOSCOPY W/ URETERAL STENT PLACEMENT: SHX1429

## 2024-03-08 LAB — CBC
HCT: 35.1 % — ABNORMAL LOW (ref 36.0–46.0)
Hemoglobin: 12.1 g/dL (ref 12.0–15.0)
MCH: 32.5 pg (ref 26.0–34.0)
MCHC: 34.5 g/dL (ref 30.0–36.0)
MCV: 94.4 fL (ref 80.0–100.0)
Platelets: 210 K/uL (ref 150–400)
RBC: 3.72 MIL/uL — ABNORMAL LOW (ref 3.87–5.11)
RDW: 11.7 % (ref 11.5–15.5)
WBC: 10 K/uL (ref 4.0–10.5)
nRBC: 0 % (ref 0.0–0.2)

## 2024-03-08 LAB — BASIC METABOLIC PANEL WITH GFR
Anion gap: 11 (ref 5–15)
BUN: 21 mg/dL — ABNORMAL HIGH (ref 6–20)
CO2: 26 mmol/L (ref 22–32)
Calcium: 8.7 mg/dL — ABNORMAL LOW (ref 8.9–10.3)
Chloride: 104 mmol/L (ref 98–111)
Creatinine, Ser: 1.03 mg/dL — ABNORMAL HIGH (ref 0.44–1.00)
GFR, Estimated: >60 mL/min (ref >60)
Glucose, Bld: 110 mg/dL — ABNORMAL HIGH (ref 70–99)
Potassium: 4 mmol/L (ref 3.5–5.1)
Sodium: 141 mmol/L (ref 135–145)

## 2024-03-08 LAB — HIV ANTIBODY (ROUTINE TESTING W REFLEX): HIV Screen 4th Generation wRfx: NONREACTIVE

## 2024-03-08 LAB — PROTIME-INR
INR: 1 (ref 0.8–1.2)
Prothrombin Time: 13.4 s (ref 11.4–15.2)

## 2024-03-08 LAB — CORTISOL-AM, BLOOD: Cortisol - AM: 17.8 ug/dL (ref 6.7–22.6)

## 2024-03-08 SURGERY — CYSTOSCOPY, WITH RETROGRADE PYELOGRAM AND URETERAL STENT INSERTION
Anesthesia: General | Site: Ureter | Laterality: Left

## 2024-03-08 MED ORDER — OXYCODONE HCL 5 MG PO TABS: 5.0000 mg | ORAL_TABLET | Freq: Once | ORAL | Status: DC | PRN

## 2024-03-08 MED ORDER — MIDAZOLAM HCL (PF) 2 MG/2ML IJ SOLN
INTRAMUSCULAR | Status: DC | PRN
  Administered 2024-03-08: 2 mg via INTRAVENOUS

## 2024-03-08 MED ORDER — AMLODIPINE BESYLATE 5 MG PO TABS
2.5000 mg | ORAL_TABLET | Freq: Every day | ORAL | Status: DC
  Administered 2024-03-08 – 2024-03-09 (×2): 2.5 mg via ORAL
  Filled 2024-03-08 (×3): qty 1

## 2024-03-08 MED ORDER — IOHEXOL 180 MG/ML  SOLN
INTRAMUSCULAR | Status: DC | PRN
  Administered 2024-03-08: 10 mL

## 2024-03-08 MED ORDER — ONDANSETRON HCL 4 MG PO TABS
4.0000 mg | ORAL_TABLET | Freq: Four times a day (QID) | ORAL | Status: DC | PRN
Start: 2024-03-08 — End: 2024-03-10

## 2024-03-08 MED ORDER — IOHEXOL 300 MG/ML  SOLN
100.0000 mL | Freq: Once | INTRAMUSCULAR | Status: AC | PRN
  Administered 2024-03-08: 100 mL via INTRAVENOUS

## 2024-03-08 MED ORDER — ROCURONIUM BROMIDE 100 MG/10ML IV SOLN
INTRAVENOUS | Status: DC | PRN
  Administered 2024-03-08: 10 mg via INTRAVENOUS
  Administered 2024-03-08: 40 mg via INTRAVENOUS

## 2024-03-08 MED ORDER — TRAZODONE HCL 50 MG PO TABS: 25.0000 mg | ORAL_TABLET | Freq: Every evening | ORAL | Status: DC | PRN

## 2024-03-08 MED ORDER — MIDAZOLAM HCL 2 MG/2ML IJ SOLN
INTRAMUSCULAR | Status: AC
  Filled 2024-03-08: qty 2

## 2024-03-08 MED ORDER — MORPHINE SULFATE (PF) 2 MG/ML IV SOLN
2.0000 mg | INTRAVENOUS | Status: DC | PRN
  Administered 2024-03-08 – 2024-03-09 (×4): 2 mg via INTRAVENOUS
  Filled 2024-03-08 (×4): qty 1

## 2024-03-08 MED ORDER — DEXAMETHASONE SOD PHOSPHATE PF 10 MG/ML IJ SOLN
INTRAMUSCULAR | Status: DC | PRN
  Administered 2024-03-08: 10 mg via INTRAVENOUS

## 2024-03-08 MED ORDER — DEXMEDETOMIDINE HCL IN NACL 200 MCG/50ML IV SOLN
INTRAVENOUS | Status: DC | PRN
  Administered 2024-03-08 (×2): 8 ug via INTRAVENOUS

## 2024-03-08 MED ORDER — LOSARTAN POTASSIUM 50 MG PO TABS
50.0000 mg | ORAL_TABLET | Freq: Two times a day (BID) | ORAL | Status: DC
  Administered 2024-03-08 – 2024-03-09 (×3): 50 mg via ORAL
  Filled 2024-03-08 (×4): qty 1

## 2024-03-08 MED ORDER — FENTANYL CITRATE (PF) 100 MCG/2ML IJ SOLN
INTRAMUSCULAR | Status: DC | PRN
  Administered 2024-03-08: 50 ug via INTRAVENOUS

## 2024-03-08 MED ORDER — PROPOFOL 10 MG/ML IV BOLUS
INTRAVENOUS | Status: AC
  Filled 2024-03-08: qty 20

## 2024-03-08 MED ORDER — LIDOCAINE HCL (CARDIAC) PF 100 MG/5ML IV SOSY
PREFILLED_SYRINGE | INTRAVENOUS | Status: DC | PRN
  Administered 2024-03-08: 100 mg via INTRAVENOUS

## 2024-03-08 MED ORDER — GLYCOPYRROLATE 0.2 MG/ML IJ SOLN
INTRAMUSCULAR | Status: DC | PRN
  Administered 2024-03-08: .2 mg via INTRAVENOUS

## 2024-03-08 MED ORDER — ENOXAPARIN SODIUM 40 MG/0.4ML IJ SOSY
40.0000 mg | PREFILLED_SYRINGE | INTRAMUSCULAR | Status: DC
  Administered 2024-03-08 – 2024-03-10 (×3): 40 mg via SUBCUTANEOUS
  Filled 2024-03-08 (×3): qty 0.4

## 2024-03-08 MED ORDER — ONDANSETRON HCL 4 MG/2ML IJ SOLN
4.0000 mg | Freq: Four times a day (QID) | INTRAMUSCULAR | Status: DC | PRN
  Administered 2024-03-08 (×2): 4 mg via INTRAVENOUS
  Filled 2024-03-08 (×2): qty 2

## 2024-03-08 MED ORDER — MAGNESIUM HYDROXIDE 400 MG/5ML PO SUSP
30.0000 mL | Freq: Every day | ORAL | Status: DC | PRN
  Administered 2024-03-10: 30 mL via ORAL
  Filled 2024-03-08: qty 30

## 2024-03-08 MED ORDER — SODIUM CHLORIDE 0.9 % IV SOLN
1.0000 g | Freq: Once | INTRAVENOUS | Status: AC
  Administered 2024-03-08: 1 g via INTRAVENOUS
  Filled 2024-03-08: qty 10

## 2024-03-08 MED ORDER — OXYCODONE HCL 5 MG/5ML PO SOLN: 5.0000 mg | Freq: Once | ORAL | Status: DC | PRN

## 2024-03-08 MED ORDER — OXYBUTYNIN CHLORIDE 5 MG PO TABS
5.0000 mg | ORAL_TABLET | Freq: Three times a day (TID) | ORAL | Status: DC | PRN
  Administered 2024-03-08 – 2024-03-10 (×4): 5 mg via ORAL
  Filled 2024-03-08 (×5): qty 1

## 2024-03-08 MED ORDER — ACETAMINOPHEN 10 MG/ML IV SOLN: 1000.0000 mg | Freq: Once | INTRAVENOUS | Status: DC | PRN

## 2024-03-08 MED ORDER — ACETAMINOPHEN 650 MG RE SUPP: 650.0000 mg | Freq: Four times a day (QID) | RECTAL | Status: DC | PRN

## 2024-03-08 MED ORDER — ONDANSETRON HCL 4 MG/2ML IJ SOLN
INTRAMUSCULAR | Status: DC | PRN
  Administered 2024-03-08: 4 mg via INTRAVENOUS

## 2024-03-08 MED ORDER — SODIUM CHLORIDE 0.9 % IV SOLN
2.0000 g | INTRAVENOUS | Status: DC
  Administered 2024-03-08 – 2024-03-09 (×2): 2 g via INTRAVENOUS
  Filled 2024-03-08 (×2): qty 20

## 2024-03-08 MED ORDER — ESMOLOL HCL 100 MG/10ML IV SOLN
INTRAVENOUS | Status: DC | PRN
  Administered 2024-03-08: 10 mg via INTRAVENOUS

## 2024-03-08 MED ORDER — SODIUM CHLORIDE 0.9 % IR SOLN
Status: DC | PRN
  Administered 2024-03-08: 3000 mL

## 2024-03-08 MED ORDER — FENTANYL CITRATE (PF) 100 MCG/2ML IJ SOLN: 25.0000 ug | INTRAMUSCULAR | Status: DC | PRN

## 2024-03-08 MED ORDER — LACTATED RINGERS IV SOLN: INTRAVENOUS | Status: DC

## 2024-03-08 MED ORDER — KETOROLAC TROMETHAMINE 15 MG/ML IJ SOLN
15.0000 mg | Freq: Four times a day (QID) | INTRAMUSCULAR | Status: DC | PRN
  Administered 2024-03-08 – 2024-03-10 (×6): 15 mg via INTRAVENOUS
  Filled 2024-03-08 (×6): qty 1

## 2024-03-08 MED ORDER — FENTANYL CITRATE (PF) 100 MCG/2ML IJ SOLN
INTRAMUSCULAR | Status: AC
  Filled 2024-03-08: qty 2

## 2024-03-08 MED ORDER — SUCCINYLCHOLINE CHLORIDE 200 MG/10ML IV SOSY
PREFILLED_SYRINGE | INTRAVENOUS | Status: DC | PRN
  Administered 2024-03-08: 100 mg via INTRAVENOUS

## 2024-03-08 MED ORDER — LACTATED RINGERS IV SOLN
150.0000 mL/h | INTRAVENOUS | Status: AC
  Administered 2024-03-08: 150 mL/h via INTRAVENOUS

## 2024-03-08 MED ORDER — PROPOFOL 10 MG/ML IV BOLUS
INTRAVENOUS | Status: DC | PRN
  Administered 2024-03-08: 150 mg via INTRAVENOUS

## 2024-03-08 MED ORDER — SUGAMMADEX SODIUM 200 MG/2ML IV SOLN
INTRAVENOUS | Status: DC | PRN
  Administered 2024-03-08: 200 mg via INTRAVENOUS

## 2024-03-08 MED ORDER — ACETAMINOPHEN 325 MG PO TABS
650.0000 mg | ORAL_TABLET | Freq: Four times a day (QID) | ORAL | Status: DC | PRN
  Administered 2024-03-08: 650 mg via ORAL
  Filled 2024-03-08: qty 2

## 2024-03-08 MED ORDER — TAMSULOSIN HCL 0.4 MG PO CAPS
0.4000 mg | ORAL_CAPSULE | Freq: Every day | ORAL | Status: DC
  Administered 2024-03-08 – 2024-03-09 (×2): 0.4 mg via ORAL
  Filled 2024-03-08 (×2): qty 1

## 2024-03-08 SURGICAL SUPPLY — 16 items
BRUSH SCRUB EZ 4% CHG (MISCELLANEOUS) ×1 IMPLANT
CATH URETL OPEN END 6X70 (CATHETERS) ×1 IMPLANT
GLOVE BIOGEL PI IND STRL 7.5 (GLOVE) ×1 IMPLANT
GOWN STRL REUS W/ TWL LRG LVL3 (GOWN DISPOSABLE) ×1 IMPLANT
GOWN STRL REUS W/ TWL XL LVL3 (GOWN DISPOSABLE) ×1 IMPLANT
GUIDEWIRE STR DUAL SENSOR (WIRE) ×1 IMPLANT
KIT TURNOVER CYSTO (KITS) ×1 IMPLANT
PACK CYSTO AR (MISCELLANEOUS) ×1 IMPLANT
SET CYSTO IRRIGATION (SET/KITS/TRAYS/PACK) ×1 IMPLANT
SOL .9 NS 3000ML IRR UROMATIC (IV SOLUTION) ×1 IMPLANT
STENT URET 6FRX22 CONTOUR (STENTS) IMPLANT
STENT URET 6FRX24 CONTOUR (STENTS) IMPLANT
STENT URET 6FRX26 CONTOUR (STENTS) IMPLANT
SURGILUBE 2OZ TUBE FLIPTOP (MISCELLANEOUS) ×1 IMPLANT
SYR 10ML LL (SYRINGE) ×1 IMPLANT
WATER STERILE IRR 500ML POUR (IV SOLUTION) ×1 IMPLANT

## 2024-03-08 NOTE — Discharge Instructions (Signed)

## 2024-03-08 NOTE — H&P (View-Only) (Signed)
 Urology Consult  Requesting physician: Darleene Dome, MD  Reason for consultation: Obstructing left ureteral calculus with UTI   Assessment/Recommendations:  1.  Left ureteral calculus 10 mm left mid ureteral calculus with severe hydronephrosis/upstream hydroureter UA with significant pyuria and no squamous epithelial cells Recommend cystoscopy with a left ureteral stent placement Discussed stone treatment/removal not recommended with ongoing UTI due to the risk of sepsis/septic shock then definitive stone treatment will need to be scheduled after infection treated The procedure was discussed including potential risk of bleeding, infection/sepsis and stent related symptoms.  She has previously had stents which have been bothersome   Chief Complaint: Kidney stone   History of Present Illness: Amber Mcbride is a 48 y.o. with a prior history of recurrent stone disease presented to the ED late last night with a 6-hour history of left flank pain that had progressively worsened becoming severe.  She had nausea without vomiting and mild dysuria.  Urinalysis was nitrite positive with 21-50 RBC and >50 WBC.  No fever or chills.  She had negative lactate and no leukocytosis.  CT abdomen pelvis with contrast showed a 6 x 10 left mid ureteral calculus with severe left hydronephrosis and upstream hydroureter.  Hyperenhancement of the ureteral mucosa was noted.  Pain was controlled with parenteral analgesics and she was started on IV antibiotic therapy.  Urine culture is pending.  Past Medical History:  Diagnosis Date   Kidney stone    Kidney stones     History reviewed. No pertinent surgical history.  Home Medications:  Current Meds  Medication Sig   amLODipine (NORVASC) 2.5 MG tablet Take 2.5 mg by mouth daily.   losartan (COZAAR) 50 MG tablet Take 50 mg by mouth 2 (two) times daily.   metFORMIN (GLUCOPHAGE) 500 MG tablet Take 500 mg by mouth daily.    Allergies: No Known  Allergies  History reviewed. No pertinent family history.  Social History:  reports that she has been smoking cigarettes. She has never used smokeless tobacco. She reports that she does not drink alcohol. No history on file for drug use.  ROS: Otherwise noncontributory except as per the HPI  Physical Exam:  Vital signs in last 24 hours: Temp:  [97.9 F (36.6 C)-99.5 F (37.5 C)] 99.1 F (37.3 C) (11/05 0727) Pulse Rate:  [87-109] 109 (11/05 0727) Resp:  [16-21] 16 (11/05 0727) BP: (98-135)/(64-85) 125/64 (11/05 0727) SpO2:  [99 %-100 %] 100 % (11/05 0727) Weight:  [63 kg] 63 kg (11/05 0246) Constitutional:  Alert and oriented, No acute distress HEENT: [place] AT, moist mucus membranes.  Trachea midline, no masses Respiratory: Normal respiratory effort GI: Abdomen is soft, nontender, nondistended, no abdominal masses Psychiatric: Normal mood and affect   Laboratory Data:  Recent Labs    03/07/24 2322 03/08/24 0343  WBC 8.9 10.0  HGB 12.5 12.1  HCT 36.8 35.1*   Recent Labs    03/07/24 2322 03/08/24 0343  NA 142 141  K 3.8 4.0  CL 107 104  CO2 26 26  GLUCOSE 121* 110*  BUN 25* 21*  CREATININE 1.03* 1.03*  CALCIUM 8.9 8.7*   Recent Labs    03/08/24 0343  INR 1.0   No results for input(s): LABURIN in the last 72 hours. Results for orders placed or performed in visit on 05/18/19  Novel Coronavirus, NAA ([facility])     Status: None   Collection Time: 05/18/19  3:04 PM   Specimen: Nasopharyngeal(NP) swabs in vial transport medium  NASOPHARYNGE  TESTING  Result Value Ref Range Status   SARS-CoV-2, NAA Not Detected Not Detected Final    Comment: This nucleic acid amplification test was developed and its performance characteristics determined by [employer]. Nucleic acid amplification tests include PCR and TMA. This test has not been FDA cleared or approved. This test has been authorized by FDA under an Emergency Use Authorization (EUA). This test is only  authorized for the duration of time the declaration that circumstances exist justifying the authorization of the emergency use of in vitro diagnostic tests for detection of SARS-CoV-2 virus and/or diagnosis of COVID-19 infection under section 564(b)(1) of the Act, 21 U.S.C. 639aaa-6(a) (1), unless the authorization is terminated or revoked sooner. When diagnostic testing is negative, the possibility of a false negative result should be considered in the context of a patient's recent exposures and the presence of clinical signs and symptoms consistent with COVID-19. An individual without symptoms of COVID-19 and who is not shedding SARS-CoV-2 virus would  expect to have a negative (not detected) result in this assay.       03/08/2024, 8:00 AM  Glendia Barba,  MD

## 2024-03-08 NOTE — Transfer of Care (Signed)
 Immediate Anesthesia Transfer of Care Note  Patient: Amber Mcbride  Procedure(s) Performed: CYSTOSCOPY, WITH RETROGRADE PYELOGRAM AND URETERAL STENT INSERTION (Left: Ureter)  Patient Location: PACU  Anesthesia Type:General  Level of Consciousness: drowsy and patient cooperative  Airway & Oxygen Therapy: Patient Spontanous Breathing and Patient connected to face mask oxygen  Post-op Assessment: Report given to RN and Post -op Vital signs reviewed and stable  Post vital signs: Reviewed and stable  Last Vitals:  Vitals Value Taken Time  BP 106/69 03/08/24 12:15  Temp    Pulse 100 03/08/24 12:16  Resp 23 03/08/24 12:16  SpO2 100 % 03/08/24 12:16  Vitals shown include unfiled device data.  Last Pain:  Vitals:   03/08/24 1050  TempSrc: Temporal  PainSc: 4          Complications: No notable events documented.

## 2024-03-08 NOTE — Assessment & Plan Note (Signed)
-   Management as above. - Her sepsis is manifested by tachycardia and tachypnea.

## 2024-03-08 NOTE — Progress Notes (Signed)
 Patient received from PACU s/p urethral stent placement, no string.  Amber Mcbride

## 2024-03-08 NOTE — Assessment & Plan Note (Signed)
-   Will continue antihypertensive therapy.

## 2024-03-08 NOTE — ED Notes (Signed)
 Pt informed of room assignment upstairs for admission. Was informed this room is a shared room. Pt agreeable to transport to assigned room. Transport requested for pt

## 2024-03-08 NOTE — Plan of Care (Signed)
 Patient was seen and examined at bedside.  She was admitted early morning after midnight due to UTI, sepsis secondary to left ureteral stone.  Patient was seen by urology, left ureteral stent was placed.  Patient was seen postprocedure, tolerated well.  Denied any complications.  Still has some pain 4/10 in the left flank area.  No any other complaints, she was resting comfortably. We will continue current treatment  No charge note, same-day admission.

## 2024-03-08 NOTE — TOC CM/SW Note (Signed)
 Transition of Care [facility]) - Inpatient Brief Assessment   Patient Details  Name: Amber Mcbride MRN: [id number] Date of Birth: [birth year]  Transition of Care [facility]) CM/SW Contact:    Corean ONEIDA Haddock, RN Phone Number: 03/08/2024, 10:31 AM   Clinical Narrative:   Transition of Care [facility] Screening Note   Patient Details  Name: Amber Mcbride Date of Birth: [birth year]   Transition of Care [facility]) CM/SW Contact:    Corean ONEIDA Haddock, RN Phone Number: 03/08/2024, 10:31 AM    Transition of Care Department [facility]) has reviewed patient and no TOC needs have been identified at this time.  If new patient transition needs arise, please place a TOC consult.  List of local PCP and [facility] information added to AVS    Transition of Care Asessment: Insurance and Status: Selfpay Patient has primary care physician: No     Prior/Current Home Services: No current home services Social Drivers of Health Review: SDOH reviewed no interventions necessary Readmission risk has been reviewed: Yes Transition of care needs: no transition of care needs at this time

## 2024-03-08 NOTE — Assessment & Plan Note (Signed)
-   She will be admitted to a medical telemetry bed. - Will continue antibiotic therapy with IV Rocephin. - Will follow blood and urine cultures. - Urology consult will be obtained. - Dr. Twylla was notified and was agreeable with the patient.

## 2024-03-08 NOTE — Assessment & Plan Note (Signed)
-   The patient will be placed on supplement coverage with NovoLog.   -.Will hold Metformin off

## 2024-03-08 NOTE — Interval H&P Note (Signed)
 History and Physical Interval Note:  03/08/2024 11:28 AM  Amber Mcbride  has presented today for surgery, with the diagnosis of left ureteral calculus.  The various methods of treatment have been discussed with the patient and family. After consideration of risks, benefits and other options for treatment, the patient has consented to  Procedure(s): CYSTOSCOPY, WITH RETROGRADE PYELOGRAM AND URETERAL STENT INSERTION (Left) as a surgical intervention.  The patient's history has been reviewed, patient examined, no change in status, stable for surgery.  I have reviewed the patient's chart and labs.  Questions were answered to the patient's satisfaction.    CV:RRR Lungs:clear  Amber Mcbride

## 2024-03-08 NOTE — H&P (Addendum)
 [place]   PATIENT NAME: Amber Mcbride    MR#:  [id number]  DATE OF BIRTH:  [birth year]  DATE OF ADMISSION:  03/07/2024  PRIMARY CARE PHYSICIAN: Patient, No Pcp Per   Patient is coming from: Home  REQUESTING/REFERRING PHYSICIAN: Gordan Huxley, MD  CHIEF COMPLAINT:   Chief Complaint  Patient presents with   Flank Pain    HISTORY OF PRESENT ILLNESS:  Amber Mcbride is a 48 y.o. female with medical history significant for hypertension and urolithiasis, who presented to the emergency room with acute onset of left flank pain that started at 5 PM with associated nausea without vomiting.  She has been having dysuria with urinary frequency and urgency without hematuria or flank pain.  She admitted to epigastric abdominal pain.  No chest pain or palpitations.  No cough or wheezing or dyspnea.  No bleeding diathesis.  ED Course: When the patient came to the ER vital signs were within normal.  Later on respiratory rate was 21 labs revealed a heart rate 93.  BMP revealed a BUN of 25 and creatinine 1.03 with blood glucose 121.  CBC was within normal.  UA was positive for UTI. EKG as reviewed by me : None Imaging: CT renal stone showed 6 mm mid left ureteral stone with moderate to severe left hydronephrosis and mild perinephric stranding.  The patient was given 15 mg IV Toradol , 4 mg of IV morphine  sulfate and and Zofran  4 mg IV, as well as 2 g of IV Rocephin.  She will be admitted to [facility] medical telemetry bed for further evaluation and management. PAST MEDICAL HISTORY:   Past Medical History:  Diagnosis Date   Kidney stone    Kidney stones    -Essential hypertension PAST SURGICAL HISTORY:  None.  SOCIAL HISTORY:   Social History   Tobacco Use   Smoking status: Every Day    Current packs/day: 1.00    Types: Cigarettes   Smokeless tobacco: Never  Substance Use Topics   Alcohol use: No    FAMILY HISTORY:   Positive for hypertension and CHF in her mother, diabetes  mellitus and hypertension in her father.  DRUG ALLERGIES:  No Known Allergies  REVIEW OF SYSTEMS:   ROS As per history of present illness. All pertinent systems were reviewed above. Constitutional, HEENT, cardiovascular, respiratory, GI, GU, musculoskeletal, neuro, psychiatric, endocrine, integumentary and hematologic systems were reviewed and are otherwise negative/unremarkable except for positive findings mentioned above in the HPI.   MEDICATIONS AT HOME:   Prior to Admission medications   Medication Sig Start Date End Date Taking? Authorizing Provider  amLODipine (NORVASC) 2.5 MG tablet Take 2.5 mg by mouth daily.   Yes [provider]  losartan (COZAAR) 50 MG tablet Take 50 mg by mouth 2 (two) times daily. 10/19/14  Yes [provider]  metFORMIN (GLUCOPHAGE) 500 MG tablet Take 500 mg by mouth daily.   Yes [provider]      VITAL SIGNS:  Blood pressure 121/78, pulse 87, temperature 99.5 F (37.5 C), temperature source Oral, resp. rate 20, height 5' 3 (1.6 m), weight 63 kg, SpO2 99%.  PHYSICAL EXAMINATION:  Physical Exam  GENERAL:  48 y.o.-year-old Caucasian female patient lying in the bed with no acute distress.  EYES: Pupils equal, round, reactive to light and accommodation. No scleral icterus. Extraocular muscles intact.  HEENT: Head atraumatic, normocephalic. Oropharynx and nasopharynx clear.  NECK:  Supple, no jugular venous distention. No thyroid enlargement, no  tenderness.  LUNGS: Normal breath sounds bilaterally, no wheezing, rales,rhonchi or crepitation. No use of accessory muscles of respiration.  CARDIOVASCULAR: Regular rate and rhythm, S1, S2 normal. No murmurs, rubs, or gallops.  ABDOMEN: Soft, nondistended, suprapubic and left CVA tenderness without rebound tenderness guarding or rigidity.   Bowel sounds present. No organomegaly or mass.  EXTREMITIES: No pedal edema, cyanosis, or clubbing.  NEUROLOGIC: Cranial nerves II through XII  are intact. Muscle strength 5/5 in all extremities. Sensation intact. Gait not checked.  PSYCHIATRIC: The patient is alert and oriented x 3.  Normal affect and good eye contact. SKIN: No obvious rash, lesion, or ulcer.   LABORATORY PANEL:   CBC Recent Labs  Lab 03/08/24 0343  WBC 10.0  HGB 12.1  HCT 35.1*  PLT 210   ------------------------------------------------------------------------------------------------------------------  Chemistries  Recent Labs  Lab 03/07/24 2322  NA 142  K 3.8  CL 107  CO2 26  GLUCOSE 121*  BUN 25*  CREATININE 1.03*  CALCIUM 8.9   ------------------------------------------------------------------------------------------------------------------  Cardiac Enzymes No results for input(s): TROPONINI in the last 168 hours. ------------------------------------------------------------------------------------------------------------------  RADIOLOGY:  CT ABDOMEN PELVIS W CONTRAST Result Date: 03/08/2024 EXAM: CT ABDOMEN AND PELVIS WITH CONTRAST 03/08/2024 12:31:13 AM TECHNIQUE: CT of the abdomen and pelvis was performed with the administration of 100 mL of iohexol (OMNIPAQUE) 300 MG/ML solution. Multiplanar reformatted images are provided for review. Automated exposure control, iterative reconstruction, and/or weight-based adjustment of the mA/kV was utilized to reduce the radiation dose to as low as reasonably achievable. COMPARISON: None available. CLINICAL HISTORY: Abdominal/flank pain, stone suspected; Pyelonephritis suspected, history of renal stones or obstruction. FINDINGS: LOWER CHEST: No acute abnormality. LIVER: The liver is unremarkable. GALLBLADDER AND BILE DUCTS: Gallbladder is unremarkable. No biliary ductal dilatation. SPLEEN: No acute abnormality. PANCREAS: No acute abnormality. ADRENAL GLANDS: No acute abnormality. KIDNEYS, URETERS AND BLADDER: 6 mm mid left ureteral stone with moderate to severe left hydronephrosis. Mild left perinephric  stranding. Bilateral renal cysts appear benign. Per consensus, no follow-up is needed for simple Bosniak type 1 and 2 renal cysts, unless the patient has a malignancy history or risk factors. No stones in the right kidney or ureter. No right hydronephrosis. No right perinephric or periureteral stranding. Urinary bladder is unremarkable. GI AND BOWEL: Stomach demonstrates no acute abnormality. There is no bowel obstruction. Normal appendix. PERITONEUM AND RETROPERITONEUM: No ascites. No free air. VASCULATURE: Aorta is normal in caliber. LYMPH NODES: No lymphadenopathy. REPRODUCTIVE ORGANS: No acute abnormality. BONES AND SOFT TISSUES: No acute osseous abnormality. No focal soft tissue abnormality. IMPRESSION: 1. 6 mm mid left ureteral stone with moderate to severe left hydronephrosis and mild perinephric stranding. Electronically signed by: Franky Crease MD 03/08/2024 12:34 AM EST RP Workstation: [id number]      IMPRESSION AND PLAN:  Assessment and Plan: * Acute unilateral obstructive uropathy - She will be admitted to a medical telemetry bed. - Will continue antibiotic therapy with IV Rocephin. - Will follow blood and urine cultures. - Urology consult will be obtained. - Dr. Twylla was notified and was agreeable with the patient.  Sepsis due to gram-negative UTI (HCC) - Management as above. - Her sepsis is manifested by tachycardia and tachypnea.  Type 2 diabetes mellitus without complications (HCC) - The patient will be placed on supplement coverage with NovoLog.   -.Will hold Metformin off  Essential hypertension - Will continue antihypertensive therapy.   DVT prophylaxis: Lovenox. Advanced Care Planning:  Code Status: full code. Family Communication:  The plan of care was  discussed in details with the patient (and family). I answered all questions. The patient agreed to proceed with the above mentioned plan. Further management will depend upon hospital course. Disposition Plan: Back  to previous home environment Consults called: Urology. All the records are reviewed and case discussed with ED provider.  Status is: Inpatient  At the time of the admission, it appears that the appropriate admission status for this patient is inpatient.  This is judged to be reasonable and necessary in order to provide the required intensity of service to ensure the patient's safety given the presenting symptoms, physical exam findings and initial radiographic and laboratory data in the context of comorbid conditions.  The patient requires inpatient status due to high intensity of service, high risk of further deterioration and high frequency of surveillance required.  I certify that at the time of admission, it is my clinical judgment that the patient will require inpatient hospital care extending more than 2 midnights.                            Dispo: The patient is from: Home              Anticipated d/c is to: Home              Patient currently is not medically stable to d/c.              Difficult to place patient: No  [place] DELENA Peaches M.D on 03/08/2024 at 4:46 AM  [facility]   From 7 PM-7 AM, contact night-coverage [email or web address]  CC: Primary care physician; Patient, No Pcp Per

## 2024-03-08 NOTE — Anesthesia Procedure Notes (Signed)
 Procedure Name: Intubation Date/Time: 03/08/2024 11:47 AM  Performed by: Ledora Duncan, CRNAPre-anesthesia Checklist: Patient identified, Emergency Drugs available, Suction available and Patient being monitored Patient Re-evaluated:Patient Re-evaluated prior to induction Oxygen Delivery Method: Circle system utilized Preoxygenation: Pre-oxygenation with 100% oxygen Induction Type: IV induction Ventilation: Mask ventilation without difficulty Laryngoscope Size: McGrath and 3 Grade View: Grade I Tube type: Oral Number of attempts: 1 Airway Equipment and Method: Stylet Placement Confirmation: ETT inserted through vocal cords under direct vision, positive ETCO2 and breath sounds checked- equal and bilateral Secured at: 21 cm Tube secured with: Tape Dental Injury: Teeth and Oropharynx as per pre-operative assessment

## 2024-03-08 NOTE — Consult Note (Signed)
 Urology Consult  Requesting physician: Darleene Dome, MD  Reason for consultation: Obstructing left ureteral calculus with UTI   Assessment/Recommendations:  1.  Left ureteral calculus 10 mm left mid ureteral calculus with severe hydronephrosis/upstream hydroureter UA with significant pyuria and no squamous epithelial cells Recommend cystoscopy with a left ureteral stent placement Discussed stone treatment/removal not recommended with ongoing UTI due to the risk of sepsis/septic shock then definitive stone treatment will need to be scheduled after infection treated The procedure was discussed including potential risk of bleeding, infection/sepsis and stent related symptoms.  She has previously had stents which have been bothersome   Chief Complaint: Kidney stone   History of Present Illness: Amber Mcbride is a 48 y.o. with a prior history of recurrent stone disease presented to the ED late last night with a 6-hour history of left flank pain that had progressively worsened becoming severe.  She had nausea without vomiting and mild dysuria.  Urinalysis was nitrite positive with 21-50 RBC and >50 WBC.  No fever or chills.  She had negative lactate and no leukocytosis.  CT abdomen pelvis with contrast showed a 6 x 10 left mid ureteral calculus with severe left hydronephrosis and upstream hydroureter.  Hyperenhancement of the ureteral mucosa was noted.  Pain was controlled with parenteral analgesics and she was started on IV antibiotic therapy.  Urine culture is pending.  Past Medical History:  Diagnosis Date   Kidney stone    Kidney stones     History reviewed. No pertinent surgical history.  Home Medications:  Current Meds  Medication Sig   amLODipine (NORVASC) 2.5 MG tablet Take 2.5 mg by mouth daily.   losartan (COZAAR) 50 MG tablet Take 50 mg by mouth 2 (two) times daily.   metFORMIN (GLUCOPHAGE) 500 MG tablet Take 500 mg by mouth daily.    Allergies: No Known  Allergies  History reviewed. No pertinent family history.  Social History:  reports that she has been smoking cigarettes. She has never used smokeless tobacco. She reports that she does not drink alcohol. No history on file for drug use.  ROS: Otherwise noncontributory except as per the HPI  Physical Exam:  Vital signs in last 24 hours: Temp:  [97.9 F (36.6 C)-99.5 F (37.5 C)] 99.1 F (37.3 C) (11/05 0727) Pulse Rate:  [87-109] 109 (11/05 0727) Resp:  [16-21] 16 (11/05 0727) BP: (98-135)/(64-85) 125/64 (11/05 0727) SpO2:  [99 %-100 %] 100 % (11/05 0727) Weight:  [63 kg] 63 kg (11/05 0246) Constitutional:  Alert and oriented, No acute distress HEENT: [place] AT, moist mucus membranes.  Trachea midline, no masses Respiratory: Normal respiratory effort GI: Abdomen is soft, nontender, nondistended, no abdominal masses Psychiatric: Normal mood and affect   Laboratory Data:  Recent Labs    03/07/24 2322 03/08/24 0343  WBC 8.9 10.0  HGB 12.5 12.1  HCT 36.8 35.1*   Recent Labs    03/07/24 2322 03/08/24 0343  NA 142 141  K 3.8 4.0  CL 107 104  CO2 26 26  GLUCOSE 121* 110*  BUN 25* 21*  CREATININE 1.03* 1.03*  CALCIUM 8.9 8.7*   Recent Labs    03/08/24 0343  INR 1.0   No results for input(s): LABURIN in the last 72 hours. Results for orders placed or performed in visit on 05/18/19  Novel Coronavirus, NAA ([facility])     Status: None   Collection Time: 05/18/19  3:04 PM   Specimen: Nasopharyngeal(NP) swabs in vial transport medium  NASOPHARYNGE  TESTING  Result Value Ref Range Status   SARS-CoV-2, NAA Not Detected Not Detected Final    Comment: This nucleic acid amplification test was developed and its performance characteristics determined by [employer]. Nucleic acid amplification tests include PCR and TMA. This test has not been FDA cleared or approved. This test has been authorized by FDA under an Emergency Use Authorization (EUA). This test is only  authorized for the duration of time the declaration that circumstances exist justifying the authorization of the emergency use of in vitro diagnostic tests for detection of SARS-CoV-2 virus and/or diagnosis of COVID-19 infection under section 564(b)(1) of the Act, 21 U.S.C. 639aaa-6(a) (1), unless the authorization is terminated or revoked sooner. When diagnostic testing is negative, the possibility of a false negative result should be considered in the context of a patient's recent exposures and the presence of clinical signs and symptoms consistent with COVID-19. An individual without symptoms of COVID-19 and who is not shedding SARS-CoV-2 virus would  expect to have a negative (not detected) result in this assay.       03/08/2024, 8:00 AM  Glendia Barba,  MD

## 2024-03-08 NOTE — Anesthesia Preprocedure Evaluation (Addendum)
 Anesthesia Evaluation  Patient identified by MRN, date of birth, ID band Patient awake    Reviewed: Allergy & Precautions, NPO status , Patient's Chart, lab work & pertinent test results  History of Anesthesia Complications Negative for: history of anesthetic complications  Airway Mallampati: I   Neck ROM: Full    Dental no notable dental hx.    Pulmonary former smoker (quit 2019)   Pulmonary exam normal breath sounds clear to auscultation       Cardiovascular hypertension, Normal cardiovascular exam Rhythm:Regular Rate:Normal     Neuro/Psych  Headaches    GI/Hepatic ,GERD  ,,  Endo/Other  diabetes, Type 2    Renal/GU      Musculoskeletal   Abdominal   Peds  Hematology negative hematology ROS (+)   Anesthesia Other Findings   Reproductive/Obstetrics                              Anesthesia Physical Anesthesia Plan  ASA: 2  Anesthesia Plan: General   Post-op Pain Management:    Induction: Intravenous  PONV Risk Score and Plan: 3 and Ondansetron , Dexamethasone and Treatment may vary due to age or medical condition  Airway Management Planned: Oral ETT and LMA  Additional Equipment:   Intra-op Plan:   Post-operative Plan: Extubation in OR  Informed Consent: I have reviewed the patients History and Physical, chart, labs and discussed the procedure including the risks, benefits and alternatives for the proposed anesthesia with the patient or authorized representative who has indicated his/her understanding and acceptance.     Dental advisory given  Plan Discussed with: CRNA  Anesthesia Plan Comments: (Patient consented for risks of anesthesia including but not limited to:  - adverse reactions to medications - damage to eyes, teeth, lips or other oral mucosa - nerve damage due to positioning  - sore throat or hoarseness - damage to heart, brain, nerves, lungs, other parts  of body or loss of life  Informed patient about role of CRNA in peri- and intra-operative care.  Patient voiced understanding.)         Anesthesia Quick Evaluation

## 2024-03-08 NOTE — Op Note (Signed)
    Preoperative diagnosis:  Left mid ureteral calculus with obstruction Urinary tract infection  Postoperative diagnosis:  Same  Procedure:  Cystoscopy Left ureteral stent placement (78F/24 cm) Left retrograde pyelography with interpretation  Surgeon: Glendia C. Navia Lindahl, M.D.  Anesthesia: General  Complications: None  Intraoperative findings:  Cystoscopy: Descensus bladder base secondary to cystocele.  No solid or papillary bladder mucosal lesions.  No efflux seen left UO Left retrograde pyelogram: Moderate-severe hydronephrosis/hydroureter  EBL: Minimal  Specimens: Urine left renal pelvis for C&S  Indication: Amber Mcbride is a 48 y.o.female with severe renal colic secondary to a 10 mm left mid ureteral calculus with CT showing severe hydronephrosis with upstream hydroureter.  UA nitrite positive with >50 WBC.  Refer to consultation note for details.  After reviewing the management options for treatment, she elected to proceed with the above surgical procedure(s). We have discussed the potential benefits and risks of the procedure, side effects of the proposed treatment, the likelihood of the patient achieving the goals of the procedure, and any potential problems that might occur during the procedure or recuperation. Informed consent has been obtained.  Description of procedure:  The patient was taken to the operating room and general anesthesia was induced.  The patient was placed in the dorsal lithotomy position, prepped and draped in the usual sterile fashion, and preoperative antibiotics were administered. A preoperative time-out was performed.   A 21 French cystoscope sheath with obturator was lubricated and passed per urethra.  A 30 degree lens was then placed and panendoscopy was performed with findings as described above.  Attention was directed to the left ureteral orifice and a 0.038 Sensor guidewire was then advanced up the ureter into the renal pelvis under  fluoroscopic guidance.  A 78F open-ended ureteral catheter was then advanced over the guidewire to the region of the renal pelvis.  The guidewire was removed and 10 mL of grossly clear urine was aspirated and sent for culture.  The guidewire was replaced followed by ureteral catheter removal.  A 78F/24 cm Contour ureteral stent was advanced over the guidewire.  The stent was positioned appropriately under fluoroscopic and cystoscopic guidance.  The wire was then removed with an adequate stent curl noted in the renal pelvis as well as in the bladder.  The bladder was then emptied and the procedure ended.  The patient appeared to tolerate the procedure well and without complications.  After anesthetic reversal the patient was transported to the PACU in stable condition.  Plan: Has been admitted to the hospitalist service for IV antibiotics Will need definitive stone treatment ~2 weeks   Glendia Barba, MD

## 2024-03-09 ENCOUNTER — Other Ambulatory Visit: Payer: Self-pay | Admitting: Physician Assistant

## 2024-03-09 ENCOUNTER — Encounter: Payer: Self-pay | Admitting: Urology

## 2024-03-09 DIAGNOSIS — N201 Calculus of ureter: Secondary | ICD-10-CM

## 2024-03-09 DIAGNOSIS — A4151 Sepsis due to Escherichia coli [E. coli]: Secondary | ICD-10-CM

## 2024-03-09 LAB — BASIC METABOLIC PANEL WITH GFR
Anion gap: 10 (ref 5–15)
BUN: 21 mg/dL — ABNORMAL HIGH (ref 6–20)
CO2: 24 mmol/L (ref 22–32)
Calcium: 8.9 mg/dL (ref 8.9–10.3)
Chloride: 107 mmol/L (ref 98–111)
Creatinine, Ser: 1.08 mg/dL — ABNORMAL HIGH (ref 0.44–1.00)
GFR, Estimated: >60 mL/min (ref >60)
Glucose, Bld: 148 mg/dL — ABNORMAL HIGH (ref 70–99)
Potassium: 3.9 mmol/L (ref 3.5–5.1)
Sodium: 141 mmol/L (ref 135–145)

## 2024-03-09 LAB — MAGNESIUM: Magnesium: 2.2 mg/dL (ref 1.7–2.4)

## 2024-03-09 LAB — IRON AND TIBC
Iron: 29 ug/dL (ref 28–170)
Saturation Ratios: 11 % (ref 10.4–31.8)
TIBC: 273 ug/dL (ref 250–450)
UIBC: 244 ug/dL

## 2024-03-09 LAB — CBC
HCT: 32.2 % — ABNORMAL LOW (ref 36.0–46.0)
Hemoglobin: 10.9 g/dL — ABNORMAL LOW (ref 12.0–15.0)
MCH: 32.2 pg (ref 26.0–34.0)
MCHC: 33.9 g/dL (ref 30.0–36.0)
MCV: 95.3 fL (ref 80.0–100.0)
Platelets: 209 K/uL (ref 150–400)
RBC: 3.38 MIL/uL — ABNORMAL LOW (ref 3.87–5.11)
RDW: 11.9 % (ref 11.5–15.5)
WBC: 14.2 K/uL — ABNORMAL HIGH (ref 4.0–10.5)
nRBC: 0 % (ref 0.0–0.2)

## 2024-03-09 LAB — HEMOGLOBIN A1C
Hgb A1c MFr Bld: 5 % (ref 4.8–5.6)
Mean Plasma Glucose: 96.8 mg/dL

## 2024-03-09 LAB — URINE CULTURE: Culture: <10000 — AB

## 2024-03-09 LAB — GLUCOSE, CAPILLARY
Glucose-Capillary: 111 mg/dL — ABNORMAL HIGH (ref 70–99)
Glucose-Capillary: 110 mg/dL — ABNORMAL HIGH (ref 70–99)

## 2024-03-09 LAB — FOLATE: Folate: 11.4 ng/mL

## 2024-03-09 LAB — PHOSPHORUS: Phosphorus: 2.5 mg/dL (ref 2.5–4.6)

## 2024-03-09 LAB — VITAMIN B12: Vitamin B-12: 567 pg/mL (ref 180–914)

## 2024-03-09 MED ORDER — INSULIN ASPART 100 UNIT/ML IJ SOLN: 0.0000 [IU] | Freq: Three times a day (TID) | INTRAMUSCULAR | Status: DC

## 2024-03-09 MED ORDER — PANTOPRAZOLE SODIUM 40 MG PO TBEC
40.0000 mg | DELAYED_RELEASE_TABLET | Freq: Two times a day (BID) | ORAL | Status: DC
  Administered 2024-03-09 – 2024-03-10 (×3): 40 mg via ORAL
  Filled 2024-03-09 (×3): qty 1

## 2024-03-09 MED ORDER — ALUM & MAG HYDROXIDE-SIMETH 200-200-20 MG/5ML PO SUSP
15.0000 mL | Freq: Four times a day (QID) | ORAL | Status: DC | PRN
  Administered 2024-03-09 – 2024-03-10 (×4): 15 mL via ORAL
  Filled 2024-03-09 (×4): qty 30

## 2024-03-09 MED ORDER — FAMOTIDINE 20 MG PO TABS: 40.0000 mg | ORAL_TABLET | Freq: Once | ORAL | Status: DC

## 2024-03-09 MED ORDER — FAMOTIDINE 20 MG PO TABS
40.0000 mg | ORAL_TABLET | Freq: Once | ORAL | Status: AC
  Administered 2024-03-09: 40 mg via ORAL
  Filled 2024-03-09: qty 2

## 2024-03-09 NOTE — Progress Notes (Signed)
 Urology Inpatient Progress Note  Subjective: No acute events overnight. She is afebrile, VSS. WBC count up, 14.2. Creatinine stable, 1.08. Urine culture growing E. coli, susceptibilities to follow; on antibiotics as below. Today she reports diffuse body pains and heartburn. She has a history of poorly tolerating stents. She is spontaneously voiding and notes significantly improved UOP since surgery.  Anti-infectives: Anti-infectives (From admission, onward)    Start     Dose/Rate Route Frequency Ordered Stop   03/08/24 2330  cefTRIAXone (ROCEPHIN) 2 g in sodium chloride  0.9 % 100 mL IVPB        2 g 200 mL/hr over 30 Minutes Intravenous Every 24 hours 03/08/24 0312 03/14/24 2329   03/08/24 0415  cefTRIAXone (ROCEPHIN) 1 g in sodium chloride  0.9 % 100 mL IVPB        1 g 200 mL/hr over 30 Minutes Intravenous  Once 03/08/24 0319 03/08/24 0414   03/07/24 2330  cefTRIAXone (ROCEPHIN) 1 g in sodium chloride  0.9 % 100 mL IVPB        1 g 200 mL/hr over 30 Minutes Intravenous STAT 03/07/24 2323 03/08/24 0003       Current Facility-Administered Medications  Medication Dose Route Frequency Provider Last Rate Last Admin   acetaminophen  (TYLENOL ) tablet 650 mg  650 mg Oral Q6H PRN Stoioff, Scott C, MD   650 mg at 03/08/24 0837   Or   acetaminophen  (TYLENOL ) suppository 650 mg  650 mg Rectal Q6H PRN Stoioff, Scott C, MD       alum & mag hydroxide-simeth (MAALOX/MYLANTA) 200-200-20 MG/5ML suspension 15 mL  15 mL Oral Q6H PRN Von Bellis, MD   15 mL at 03/09/24 0810   amLODipine (NORVASC) tablet 2.5 mg  2.5 mg Oral Daily Von Bellis, MD   2.5 mg at 03/09/24 0811   cefTRIAXone (ROCEPHIN) 2 g in sodium chloride  0.9 % 100 mL IVPB  2 g Intravenous Q24H Stoioff, Scott C, MD   Stopped at 03/08/24 2317   enoxaparin (LOVENOX) injection 40 mg  40 mg Subcutaneous Q24H Stoioff, Scott C, MD   40 mg at 03/09/24 0811   ketorolac  (TORADOL ) 15 MG/ML injection 15 mg  15 mg Intravenous Q6H PRN Stoioff, Scott C, MD    15 mg at 03/09/24 0810   losartan (COZAAR) tablet 50 mg  50 mg Oral BID Von Bellis, MD   50 mg at 03/09/24 0811   magnesium hydroxide (MILK OF MAGNESIA) suspension 30 mL  30 mL Oral Daily PRN Stoioff, Scott C, MD       morphine  (PF) 2 MG/ML injection 2 mg  2 mg Intravenous Q4H PRN Stoioff, Scott C, MD   2 mg at 03/09/24 1101   ondansetron  (ZOFRAN ) tablet 4 mg  4 mg Oral Q6H PRN Stoioff, Scott C, MD       Or   ondansetron  (ZOFRAN ) injection 4 mg  4 mg Intravenous Q6H PRN Stoioff, Scott C, MD   4 mg at 03/08/24 2241   oxybutynin (DITROPAN) tablet 5 mg  5 mg Oral Q8H PRN Stoioff, Scott C, MD   5 mg at 03/09/24 0820   pantoprazole (PROTONIX) EC tablet 40 mg  40 mg Oral BID Von Bellis, MD   40 mg at 03/09/24 1101   tamsulosin  (FLOMAX ) capsule 0.4 mg  0.4 mg Oral Daily Stoioff, Scott C, MD   0.4 mg at 03/08/24 1647   traZODone (DESYREL) tablet 25 mg  25 mg Oral QHS PRN Stoioff, Glendia BROCKS, MD  Objective: Vital signs in last 24 hours: Temp:  [97.7 F (36.5 C)-98.8 F (37.1 C)] 98.2 F (36.8 C) (11/06 0922) Pulse Rate:  [63-99] 78 (11/06 0922) Resp:  [14-18] 16 (11/06 0922) BP: (97-113)/(56-76) 113/69 (11/06 0922) SpO2:  [95 %-100 %] 98 % (11/06 0922)  Intake/Output from previous day: 11/05 0701 - 11/06 0700 In: 786.1 [P.O.:125; I.V.:561.1; IV Piggyback:100] Out: -  Intake/Output this shift: No intake/output data recorded.  Physical Exam Vitals and nursing note reviewed.  Constitutional:      General: She is not in acute distress.    Appearance: She is not ill-appearing, toxic-appearing or diaphoretic.  HENT:     Head: Normocephalic and atraumatic.  Pulmonary:     Effort: Pulmonary effort is normal. No respiratory distress.  Skin:    General: Skin is warm and dry.  Neurological:     Mental Status: She is alert and oriented to person, place, and time.  Psychiatric:        Mood and Affect: Mood normal.        Behavior: Behavior normal.    Lab Results:  Recent Labs     03/08/24 0343 03/09/24 0448  WBC 10.0 14.2*  HGB 12.1 10.9*  HCT 35.1* 32.2*  PLT 210 209   BMET Recent Labs    03/08/24 0343 03/09/24 0448  NA 141 141  K 4.0 3.9  CL 104 107  CO2 26 24  GLUCOSE 110* 148*  BUN 21* 21*  CREATININE 1.03* 1.08*  CALCIUM 8.7* 8.9   PT/INR Recent Labs    03/08/24 0343  LABPROT 13.4  INR 1.0   Studies/Results: DG OR UROLOGY CYSTO IMAGE ([facility] ONLY) Result Date: 03/08/2024 There is no interpretation for this exam.  This order is for images obtained during a surgical procedure.  Please See Surgeries Tab for more information regarding the procedure.   CT ABDOMEN PELVIS W CONTRAST Result Date: 03/08/2024 EXAM: CT ABDOMEN AND PELVIS WITH CONTRAST 03/08/2024 12:31:13 AM TECHNIQUE: CT of the abdomen and pelvis was performed with the administration of 100 mL of iohexol (OMNIPAQUE) 300 MG/ML solution. Multiplanar reformatted images are provided for review. Automated exposure control, iterative reconstruction, and/or weight-based adjustment of the mA/kV was utilized to reduce the radiation dose to as low as reasonably achievable. COMPARISON: None available. CLINICAL HISTORY: Abdominal/flank pain, stone suspected; Pyelonephritis suspected, history of renal stones or obstruction. FINDINGS: LOWER CHEST: No acute abnormality. LIVER: The liver is unremarkable. GALLBLADDER AND BILE DUCTS: Gallbladder is unremarkable. No biliary ductal dilatation. SPLEEN: No acute abnormality. PANCREAS: No acute abnormality. ADRENAL GLANDS: No acute abnormality. KIDNEYS, URETERS AND BLADDER: 6 mm mid left ureteral stone with moderate to severe left hydronephrosis. Mild left perinephric stranding. Bilateral renal cysts appear benign. Per consensus, no follow-up is needed for simple Bosniak type 1 and 2 renal cysts, unless the patient has a malignancy history or risk factors. No stones in the right kidney or ureter. No right hydronephrosis. No right perinephric or periureteral  stranding. Urinary bladder is unremarkable. GI AND BOWEL: Stomach demonstrates no acute abnormality. There is no bowel obstruction. Normal appendix. PERITONEUM AND RETROPERITONEUM: No ascites. No free air. VASCULATURE: Aorta is normal in caliber. LYMPH NODES: No lymphadenopathy. REPRODUCTIVE ORGANS: No acute abnormality. BONES AND SOFT TISSUES: No acute osseous abnormality. No focal soft tissue abnormality. IMPRESSION: 1. 6 mm mid left ureteral stone with moderate to severe left hydronephrosis and mild perinephric stranding. Electronically signed by: Franky Crease MD 03/08/2024 12:34 AM EST RP Workstation: [id number]   Assessment &  Plan: 48 y.o. female with PMH nephrolithiasis now s/p left ureteral stent placement with Dr. Twylla for management of a 10 mm mid left ureteral stone with sepsis due to E. coli UTI.  She is clinically improving on empiric antibiotics.  She is tolerating her stent with some generalized discomfort.  Flomax  and oxybutynin have been started.  We discussed that she will require 10-14 days of culture-appropriate antibiotics to treat her infection, followed by outpatient left ureteroscopy with laser lithotripsy and stent exchange in 2-3 weeks to treat her stone. She expressed understanding.  Recommendations: -Continue empiric antibiotics and follow cultures for a total of 10-14 days of culture-appropriate therapy. - Continue Flomax  0.4mg  daily and oxybutynin 5mg  every 8 hours as needed for stent discomfort. -Outpatient left URS/LL/stent exchange with Dr. Twylla in 2-3 weeks; our scheduler will contact her after discharge to arrange  Lucie Hones, PA-C 03/09/2024

## 2024-03-09 NOTE — Anesthesia Postprocedure Evaluation (Signed)
 Anesthesia Post Note  Patient: RAILEY GLAD  Procedure(s) Performed: CYSTOSCOPY, WITH RETROGRADE PYELOGRAM AND URETERAL STENT INSERTION (Left: Ureter)  Patient location during evaluation: PACU Anesthesia Type: General Level of consciousness: awake and alert, oriented and patient cooperative Pain management: pain level controlled Vital Signs Assessment: post-procedure vital signs reviewed and stable Respiratory status: spontaneous breathing, nonlabored ventilation and respiratory function stable Cardiovascular status: blood pressure returned to baseline and stable Postop Assessment: adequate PO intake Anesthetic complications: no   No notable events documented.   Last Vitals:  Vitals:   03/08/24 1945 03/09/24 0409  BP: 107/76 (!) 100/59  Pulse:  63  Resp: 16 16  Temp: 36.7 C 36.9 C  SpO2: 99% 100%    Last Pain:  Vitals:   03/09/24 0409  TempSrc: Oral  PainSc:                  Brittain Hosie

## 2024-03-09 NOTE — Progress Notes (Signed)
 [facility] Progress Note  Patient: Amber Mcbride    FMW:[id number]  DOA: 03/07/2024     Date of Service: the patient was seen and examined on 03/09/2024  Chief Complaint  Patient presents with   Flank Pain   Brief hospital course: Amber Mcbride is a 48 y.o. female with medical history significant for hypertension and urolithiasis, who presented to the emergency room with acute onset of left flank pain that started at 5 PM with associated nausea without vomiting.  She has been having dysuria with urinary frequency and urgency without hematuria or flank pain.  She admitted to epigastric abdominal pain.  No chest pain or palpitations.  No cough or wheezing or dyspnea.  No bleeding diathesis.   ED Course: When the patient came to the ER vital signs were within normal.  Later on respiratory rate was 21 labs revealed a heart rate 93.  BMP revealed a BUN of 25 and creatinine 1.03 with blood glucose 121.  CBC was within normal.  UA was positive for UTI. EKG as reviewed by me : None Imaging: CT renal stone showed 6 mm mid left ureteral stone with moderate to severe left hydronephrosis and mild perinephric stranding.   The patient was given 15 mg IV Toradol , 4 mg of IV morphine  sulfate and and Zofran  4 mg IV, as well as 2 g of IV Rocephin.  She will be admitted to [facility] medical telemetry bed for further evaluation and management.   Assessment and Plan:   # Acute unilateral obstructive uropathy - Continue ceftriaxone - Urology consulted Dr. Twylla, s/p left ureteral stent insertion done on 11/5 Started Flomax  0.4 mg p.o. daily, oxybutynin as needed Urine culture growing E. coli, sensitive report is pending Discussed with urology to wait for urine culture sensitivity report and then plan for discharge tomorrow a.m.    # Sepsis due to gram-negative UTI (HCC) - Management as above. - Her sepsis is manifested by tachycardia and tachypnea.   # Type 2 diabetes mellitus without complications  (HCC) - Started NovoLog sliding scale  - hold off Metformin during hospital stay Monitor CBG, continue diabetic diet   # Essential hypertension Continue amlodipine 2.5 mg and losartan 50 mg p.o. twice daily with holding parameter Blood pressure remains soft, continue to monitor and titrate medications according  # GERD, started PPI   Body mass index is 24.62 kg/m.  Interventions:  Diet: Diabetic diet DVT Prophylaxis: Subcutaneous Lovenox   Advance goals of care discussion: Full code  Family Communication: family was present at bedside, at the time of interview.  The pt provided permission to discuss medical plan with the family. Opportunity was given to ask question and all questions were answered satisfactorily.   Disposition:  Pt is from Home, admitted with left ureteral obstruction status post stent insertion, sepsis due to UTI, still on IV antibiotics, urine culture sensitivities pending, which precludes a safe discharge. Discharge to home, when stable, most likely tomorrow a.m.  Subjective: No significant events overnight.  Patient was complaining of having abdominal pain mostly in the epigastric area and on the left side, she is being having symptoms of GERD and using more Tums. Patient agreed to start PPI.   Physical Exam: General: NAD, lying comfortably Appear in no distress, affect appropriate Eyes: PERRLA ENT: Oral Mucosa Clear, moist  Neck: no JVD,  Cardiovascular: S1 and S2 Present, no Murmur,  Respiratory: good respiratory effort, Bilateral Air entry equal and Decreased, no Crackles, no wheezes Abdomen: BS  present, Soft and epigastric and left flank tenderness,  Skin: no rashes Extremities: no Pedal edema, no calf tenderness Neurologic: without any new focal findings Gait not checked due to patient safety concerns  Vitals:   03/08/24 1704 03/08/24 1945 03/09/24 0409 03/09/24 0922  BP: 105/76 107/76 (!) 100/59 113/69  Pulse: 79  63 78  Resp: 16 16 16 16    Temp: 98 F (36.7 C) 98 F (36.7 C) 98.4 F (36.9 C) 98.2 F (36.8 C)  TempSrc: Oral  Oral   SpO2: 99% 99% 100% 98%  Weight:      Height:        Intake/Output Summary (Last 24 hours) at 03/09/2024 1129 Last data filed at 03/09/2024 0300 Gross per 24 hour  Intake 786.1 ml  Output --  Net 786.1 ml   Filed Weights   03/08/24 0246  Weight: 63 kg    Data Reviewed: I have personally reviewed and interpreted daily labs, tele strips, imagings as discussed above. I reviewed all nursing notes, pharmacy notes, vitals, pertinent old records I have discussed plan of care as described above with RN and patient/family.  CBC: Recent Labs  Lab 03/07/24 2322 03/08/24 0343 03/09/24 0448  WBC 8.9 10.0 14.2*  NEUTROABS 6.8  --   --   HGB 12.5 12.1 10.9*  HCT 36.8 35.1* 32.2*  MCV 95.1 94.4 95.3  PLT 226 210 209   Basic Metabolic Panel: Recent Labs  Lab 03/07/24 2322 03/08/24 0343 03/09/24 0448  NA 142 141 141  K 3.8 4.0 3.9  CL 107 104 107  CO2 26 26 24   GLUCOSE 121* 110* 148*  BUN 25* 21* 21*  CREATININE 1.03* 1.03* 1.08*  CALCIUM 8.9 8.7* 8.9  MG  --   --  2.2  PHOS  --   --  2.5    Studies: No results found.  Scheduled Meds:  amLODipine  2.5 mg Oral Daily   enoxaparin (LOVENOX) injection  40 mg Subcutaneous Q24H   losartan  50 mg Oral BID   pantoprazole  40 mg Oral BID   tamsulosin   0.4 mg Oral Daily   Continuous Infusions:  cefTRIAXone (ROCEPHIN)  IV Stopped (03/08/24 2317)   PRN Meds: acetaminophen  **OR** acetaminophen , alum & mag hydroxide-simeth, ketorolac , magnesium hydroxide, morphine  injection, ondansetron  **OR** ondansetron  (ZOFRAN ) IV, oxybutynin, traZODone  Time spent: 35 minutes  Author: ELVAN SOR. MD [facility] 03/09/2024 11:29 AM  To reach On-call, see care teams to locate the attending and reach out to them via [email or web address]. If 7PM-7AM, please contact night-coverage If you still have difficulty reaching the attending provider,  please page the [facility] (Director on Call) for [facility] on amion for assistance.

## 2024-03-09 NOTE — Plan of Care (Signed)
  Problem: Education: Goal: Knowledge of General Education information will improve Description: Including pain rating scale, medication(s)/side effects and non-pharmacologic comfort measures Outcome: Progressing   Problem: Activity: Goal: Risk for activity intolerance will decrease Outcome: Progressing   Problem: Elimination: Goal: Will not experience complications related to bowel motility Outcome: Progressing Goal: Will not experience complications related to urinary retention Outcome: Progressing   Problem: Pain Managment: Goal: General experience of comfort will improve and/or be controlled Outcome: Progressing

## 2024-03-10 ENCOUNTER — Other Ambulatory Visit: Payer: Self-pay

## 2024-03-10 LAB — URINE CULTURE: Culture: >=100000 — AB

## 2024-03-10 LAB — BASIC METABOLIC PANEL WITH GFR
Anion gap: 6 (ref 5–15)
BUN: 24 mg/dL — ABNORMAL HIGH (ref 6–20)
CO2: 26 mmol/L (ref 22–32)
Calcium: 8.5 mg/dL — ABNORMAL LOW (ref 8.9–10.3)
Chloride: 110 mmol/L (ref 98–111)
Creatinine, Ser: 1.04 mg/dL — ABNORMAL HIGH (ref 0.44–1.00)
GFR, Estimated: >60 mL/min (ref >60)
Glucose, Bld: 90 mg/dL (ref 70–99)
Potassium: 4.1 mmol/L (ref 3.5–5.1)
Sodium: 142 mmol/L (ref 135–145)

## 2024-03-10 LAB — CBC
HCT: 31.4 % — ABNORMAL LOW (ref 36.0–46.0)
Hemoglobin: 10.4 g/dL — ABNORMAL LOW (ref 12.0–15.0)
MCH: 32.1 pg (ref 26.0–34.0)
MCHC: 33.1 g/dL (ref 30.0–36.0)
MCV: 96.9 fL (ref 80.0–100.0)
Platelets: 188 K/uL (ref 150–400)
RBC: 3.24 MIL/uL — ABNORMAL LOW (ref 3.87–5.11)
RDW: 11.9 % (ref 11.5–15.5)
WBC: 7.4 K/uL (ref 4.0–10.5)
nRBC: 0 % (ref 0.0–0.2)

## 2024-03-10 LAB — GLUCOSE, CAPILLARY
Glucose-Capillary: 69 mg/dL — ABNORMAL LOW (ref 70–99)
Glucose-Capillary: 82 mg/dL (ref 70–99)

## 2024-03-10 MED ORDER — LOSARTAN POTASSIUM 50 MG PO TABS: 50.0000 mg | ORAL_TABLET | Freq: Two times a day (BID) | ORAL | Status: DC

## 2024-03-10 MED ORDER — NITROFURANTOIN MONOHYD MACRO 100 MG PO CAPS
100.0000 mg | ORAL_CAPSULE | Freq: Two times a day (BID) | ORAL | 0 refills | Status: AC
Start: 2024-03-10 — End: 2024-03-22
  Filled 2024-03-10: qty 24, 12d supply, fill #0

## 2024-03-10 MED ORDER — OXYBUTYNIN CHLORIDE 5 MG PO TABS
5.0000 mg | ORAL_TABLET | Freq: Three times a day (TID) | ORAL | 0 refills | Status: AC | PRN
  Filled 2024-03-10: qty 60, 30d supply, fill #0

## 2024-03-10 MED ORDER — OXYCODONE HCL 5 MG PO TABS
5.0000 mg | ORAL_TABLET | Freq: Three times a day (TID) | ORAL | 0 refills | Status: AC | PRN
  Filled 2024-03-10: qty 20, 7d supply, fill #0

## 2024-03-10 MED ORDER — TAMSULOSIN HCL 0.4 MG PO CAPS
0.4000 mg | ORAL_CAPSULE | Freq: Every day | ORAL | 0 refills | Status: AC
  Filled 2024-03-10: qty 30, 30d supply, fill #0

## 2024-03-10 MED ORDER — PANTOPRAZOLE SODIUM 40 MG PO TBEC
DELAYED_RELEASE_TABLET | ORAL | 0 refills | Status: DC
  Filled 2024-03-10: qty 28, 21d supply, fill #0

## 2024-03-10 NOTE — Plan of Care (Signed)
  Problem: Education: Goal: Knowledge of General Education information will improve Description: Including pain rating scale, medication(s)/side effects and non-pharmacologic comfort measures Outcome: Progressing   Problem: Health Behavior/Discharge Planning: Goal: Ability to manage health-related needs will improve Outcome: Progressing   Problem: Activity: Goal: Risk for activity intolerance will decrease Outcome: Progressing   Problem: Nutrition: Goal: Adequate nutrition will be maintained Outcome: Progressing   Problem: Coping: Goal: Level of anxiety will decrease Outcome: Progressing   Problem: Pain Managment: Goal: General experience of comfort will improve and/or be controlled Outcome: Progressing   Problem: Skin Integrity: Goal: Risk for impaired skin integrity will decrease Outcome: Progressing

## 2024-03-10 NOTE — Inpatient Diabetes Management (Signed)
 Inpatient Diabetes Program Recommendations  AACE/ADA: New Consensus Statement on Inpatient Glycemic Control (2015)  Target Ranges:  Prepandial:   less than 140 mg/dL      Peak postprandial:   less than 180 mg/dL (1-2 hours)      Critically ill patients:  140 - 180 mg/dL    Latest Reference Range & Units 03/09/24 14:10  Hemoglobin A1C 4.8 - 5.6 % 5.0    Latest Reference Range & Units 03/09/24 15:33 03/09/24 21:26 03/10/24 07:30  Glucose-Capillary 70 - 99 mg/dL 888 (H) 889 (H) 69 (L)  (H): Data is abnormally high (L): Data is abnormally low   Admit with: Obstructing left ureteral calculus with UTI   History: DM2  Home DM Meds: Metformin 500 mg daily  Current Orders: Novolog Moderate Correction Scale/ SSI (0-15 units) TID AC    MD- Note Mild Hypoglycemia this AM Current A1c shows excellent glucose control at home  Please consider stopping Novolog SSI for now but continue CBG checks TID    --Will follow patient during hospitalization--  Adina Rudolpho Arrow RN, MSN, CDCES Diabetes Coordinator Inpatient Glycemic Control Team Team Pager: [phone] (8a-5p)

## 2024-03-10 NOTE — Discharge Summary (Signed)
 [facility] Discharge Summary   Patient: Amber Mcbride FMW:[id number]  PCP: Amber Mcbride  Date of admission: 03/07/2024   Date of discharge:  03/10/2024     Discharge Diagnoses:  Principal Problem:   Acute unilateral obstructive uropathy Active Problems:   Sepsis due to gram-negative UTI [facility])   Essential hypertension   Type 2 diabetes mellitus without complications (HCC)   Admitted From: Home Disposition:  Home   Recommendations for Outpatient Follow-up:  Follow-up with PCP in 1 week Follow-up with urology in 2 weeks Follow up LABS/TEST:     Follow-up Information     PCP Follow up in 1 week(s).          Stoioff, Glendia BROCKS, MD Follow up in 2 week(s).   Specialty: Urology Contact information: [address] [place] [address] [place] [address] [phone]                Diet recommendation: Cardiac diet  Activity: The patient is advised to gradually reintroduce usual activities, as tolerated  Discharge Condition: stable  Code Status: Full code   History of present illness: As Mcbride the H and P dictated on admission.  Hospital Course:  Amber Mcbride is a 48 y.o. female with medical history significant for hypertension and urolithiasis, who presented to the emergency room with acute onset of left flank pain that started at 5 PM with associated nausea without vomiting.  She has been having dysuria with urinary frequency and urgency without hematuria or flank pain.  She admitted to epigastric abdominal pain.  No chest pain or palpitations.  No cough or wheezing or dyspnea.  No bleeding diathesis.   ED Course: When the patient came to the ER vital signs were within normal.  Later on respiratory rate was 21 labs revealed a heart rate 93.  BMP revealed a BUN of 25 and creatinine 1.03 with blood glucose 121.  CBC was within normal.  UA was positive for UTI. EKG as reviewed by me : None Imaging: CT renal stone showed 6 mm mid left ureteral stone with  moderate to severe left hydronephrosis and mild perinephric stranding.   The patient was given 15 mg IV Toradol , 4 mg of IV morphine  sulfate and and Zofran  4 mg IV, as well as 2 g of IV Rocephin.  She will be admitted to [facility] [place] medical telemetry bed for further evaluation and management.     Assessment and Plan:   # Acute unilateral obstructive uropathy - Continue ceftriaxone - Urology consulted Dr. Twylla, s/p left ureteral stent insertion done on 11/5 Started Flomax  0.4 mg p.o. daily, oxybutynin as needed Urine culture growing E. coli, sensitive to Macrobid.  Resistant to Augmentin and ciprofloxacin. Patient was discharged on Macrobid 100 mg p.o. twice daily for 12 days to complete 14-day course.  Advised to follow-up with urology in 2 weeks.   # Sepsis due to E. coli UTI: s/p Management as above. - sepsis was manifested by tachycardia and tachypnea.   # Transient hyperglycemia followed by hypoglycemia Hemoglobin A1c 5.0, No diabetes mellitus. Patient was on metformin at home which has been continued Advised to follow with PCP  # Essential hypertension Continue amlodipine 2.5 mg and losartan 50 mg p.o. twice daily with holding parameter Blood pressure remains soft, continue to monitor and titrate medications according. F/u with PCP   # GERD, started PPI    Body mass index is 24.62 kg/m.  Nutrition Interventions:  Pain control  -    Controlled Substance Reporting System database could not be reviewed as website was not working.  - Oxycodone  5 mg p.o. 3 times daily as needed 20 tablets prescribed for pain control - Patient was instructed, not to drive, operate heavy machinery, perform activities at heights, swimming or participation in water activities or provide baby sitting services while on Pain, Sleep and Anxiety Medications; until her outpatient Physician has advised to do so again.  - Also recommended to not to take more than prescribed Pain, Sleep and Anxiety  Medications.  Patient was ambulatory without any assistance.  On the day of the discharge the patient's vitals were stable, and no other acute medical condition were reported by patient. the patient was felt safe to be discharge at Home .  Consultants: Urology Procedures: s/p left ureteral stent insertion done on 11/5   Discharge Exam: General: Appear in no distress, Oral Mucosa Clear, moist. Cardiovascular: S1 and S2 Present, no Murmur, Respiratory: normal respiratory effort, Bilateral Air entry present and no Crackles, no wheezes Abdomen: BS present, Soft and mild Left flank tenderness. Extremities: no Pedal edema, no calf tenderness Neurology: alert and oriented to time, place, and person affect appropriate.  Filed Weights   03/08/24 0246  Weight: 63 kg   Vitals:   03/10/24 0500 03/10/24 0726  BP: 113/80 111/73  Pulse: 69 (!) 59  Resp: 17 15  Temp: 98.5 F (36.9 C) 98.8 F (37.1 C)  SpO2:  95%    DISCHARGE MEDICATION: Allergies as of 03/10/2024   No Known Allergies      Medication List     TAKE these medications    amLODipine 2.5 MG tablet Commonly known as: NORVASC Take 2.5 mg by mouth daily.   losartan 50 MG tablet Commonly known as: COZAAR Take 50 mg by mouth 2 (two) times daily.   metFORMIN 500 MG tablet Commonly known as: GLUCOPHAGE Take 500 mg by mouth daily.   nitrofurantoin (macrocrystal-monohydrate) 100 MG capsule Commonly known as: Macrobid Take 1 capsule (100 mg total) by mouth 2 (two) times daily for 12 days.   oxybutynin 5 MG tablet Commonly known as: DITROPAN Take 1 tablet (5 mg total) by mouth every 8 (eight) hours as needed for bladder spasms.   oxyCODONE  5 MG immediate release tablet Commonly known as: Roxicodone  Take 1 tablet (5 mg total) by mouth every 8 (eight) hours as needed for up to 7 days for severe pain (pain score 7-10).   pantoprazole 40 MG tablet Commonly known as: PROTONIX Take 1 tablet (40 mg total) by mouth 2  (two) times daily for 7 days, THEN 1 tablet (40 mg total) daily for 14 days. Start taking on: March 10, 2024   tamsulosin  0.4 MG Caps capsule Commonly known as: FLOMAX  Take 1 capsule (0.4 mg total) by mouth daily.       No Known Allergies Discharge Instructions     Call MD for:  difficulty breathing, headache or visual disturbances   Complete by: As directed    Call MD for:  extreme fatigue   Complete by: As directed    Call MD for:  persistant dizziness or light-headedness   Complete by: As directed    Call MD for:  persistant nausea and vomiting   Complete by: As directed    Call MD for:  severe uncontrolled pain   Complete by: As directed    Call MD for:  temperature >100.4   Complete by: As directed    Discharge instructions   Complete  by: As directed    Follow-up with PCP in 1 week Follow-up with urology in 2 weeks   Increase activity slowly   Complete by: As directed    No wound care   Complete by: As directed        The results of significant diagnostics from this hospitalization (including imaging, microbiology, ancillary and laboratory) are listed below for reference.    Significant Diagnostic Studies: DG OR UROLOGY CYSTO IMAGE ([facility] ONLY) Result Date: 03/08/2024 There is no interpretation for this exam.  This order is for images obtained during a surgical procedure.  Please See Surgeries Tab for more information regarding the procedure.   CT ABDOMEN PELVIS W CONTRAST Result Date: 03/08/2024 EXAM: CT ABDOMEN AND PELVIS WITH CONTRAST 03/08/2024 12:31:13 AM TECHNIQUE: CT of the abdomen and pelvis was performed with the administration of 100 mL of iohexol (OMNIPAQUE) 300 MG/ML solution. Multiplanar reformatted images are provided for review. Automated exposure control, iterative reconstruction, and/or weight-based adjustment of the mA/kV was utilized to reduce the radiation dose to as low as reasonably achievable. COMPARISON: None available. CLINICAL HISTORY:  Abdominal/flank pain, stone suspected; Pyelonephritis suspected, history of renal stones or obstruction. FINDINGS: LOWER CHEST: No acute abnormality. LIVER: The liver is unremarkable. GALLBLADDER AND BILE DUCTS: Gallbladder is unremarkable. No biliary ductal dilatation. SPLEEN: No acute abnormality. PANCREAS: No acute abnormality. ADRENAL GLANDS: No acute abnormality. KIDNEYS, URETERS AND BLADDER: 6 mm mid left ureteral stone with moderate to severe left hydronephrosis. Mild left perinephric stranding. Bilateral renal cysts appear benign. [facility] consensus, no follow-up is needed for simple Bosniak type 1 and 2 renal cysts, unless the patient has a malignancy history or risk factors. No stones in the right kidney or ureter. No right hydronephrosis. No right perinephric or periureteral stranding. Urinary bladder is unremarkable. GI AND BOWEL: Stomach demonstrates no acute abnormality. There is no bowel obstruction. Normal appendix. PERITONEUM AND RETROPERITONEUM: No ascites. No free air. VASCULATURE: Aorta is normal in caliber. LYMPH NODES: No lymphadenopathy. REPRODUCTIVE ORGANS: No acute abnormality. BONES AND SOFT TISSUES: No acute osseous abnormality. No focal soft tissue abnormality. IMPRESSION: 1. 6 mm mid left ureteral stone with moderate to severe left hydronephrosis and mild perinephric stranding. Electronically signed by: Franky Crease MD 03/08/2024 12:34 AM EST RP Workstation: [id number]    Microbiology: Recent Results (from the past 240 hours)  Urine Culture     Status: Abnormal   Collection Time: 03/07/24 10:53 PM   Specimen: Urine, Clean Catch  Result Value Ref Range Status   Specimen Description   Final    URINE, CLEAN CATCH Performed at [facility], [address] [place], [place] [address]    Special Requests   Final    NONE Performed at [facility], [address] [place] [place], [place] [address]    Culture >=100,000 COLONIES/mL ESCHERICHIA COLI (A)  Final   Report  Status 03/10/2024 FINAL  Final   Organism ID, Bacteria ESCHERICHIA COLI (A)  Final      Susceptibility   Escherichia coli - MIC*    AMPICILLIN >=32 RESISTANT Resistant     CEFAZOLIN (URINE) Value in next row Resistant      >=32 RESISTANTThis is a modified FDA-approved test that has been validated and its performance characteristics determined by the reporting laboratory.  This laboratory is certified under the Clinical Laboratory Improvement Amendments CLIA as qualified to perform high complexity clinical laboratory testing.    CEFEPIME Value in next row Sensitive      >=32 RESISTANTThis  is a modified FDA-approved test that has been validated and its performance characteristics determined by the reporting laboratory.  This laboratory is certified under the Clinical Laboratory Improvement Amendments CLIA as qualified to perform high complexity clinical laboratory testing.    ERTAPENEM Value in next row Sensitive      >=32 RESISTANTThis is a modified FDA-approved test that has been validated and its performance characteristics determined by the reporting laboratory.  This laboratory is certified under the Clinical Laboratory Improvement Amendments CLIA as qualified to perform high complexity clinical laboratory testing.    CEFTRIAXONE Value in next row Sensitive      >=32 RESISTANTThis is a modified FDA-approved test that has been validated and its performance characteristics determined by the reporting laboratory.  This laboratory is certified under the Clinical Laboratory Improvement Amendments CLIA as qualified to perform high complexity clinical laboratory testing.    CIPROFLOXACIN Value in next row Intermediate      >=32 RESISTANTThis is a modified FDA-approved test that has been validated and its performance characteristics determined by the reporting laboratory.  This laboratory is certified under the Clinical Laboratory Improvement Amendments CLIA as qualified to perform high complexity clinical  laboratory testing.    GENTAMICIN Value in next row Sensitive      >=32 RESISTANTThis is a modified FDA-approved test that has been validated and its performance characteristics determined by the reporting laboratory.  This laboratory is certified under the Clinical Laboratory Improvement Amendments CLIA as qualified to perform high complexity clinical laboratory testing.    NITROFURANTOIN Value in next row Sensitive      >=32 RESISTANTThis is a modified FDA-approved test that has been validated and its performance characteristics determined by the reporting laboratory.  This laboratory is certified under the Clinical Laboratory Improvement Amendments CLIA as qualified to perform high complexity clinical laboratory testing.    TRIMETH/SULFA Value in next row Resistant      >=32 RESISTANTThis is a modified FDA-approved test that has been validated and its performance characteristics determined by the reporting laboratory.  This laboratory is certified under the Clinical Laboratory Improvement Amendments CLIA as qualified to perform high complexity clinical laboratory testing.    AMPICILLIN/SULBACTAM Value in next row Resistant      >=32 RESISTANTThis is a modified FDA-approved test that has been validated and its performance characteristics determined by the reporting laboratory.  This laboratory is certified under the Clinical Laboratory Improvement Amendments CLIA as qualified to perform high complexity clinical laboratory testing.    PIP/TAZO Value in next row Sensitive      <=4 SENSITIVEThis is a modified FDA-approved test that has been validated and its performance characteristics determined by the reporting laboratory.  This laboratory is certified under the Clinical Laboratory Improvement Amendments CLIA as qualified to perform high complexity clinical laboratory testing.    MEROPENEM Value in next row Sensitive      <=4 SENSITIVEThis is a modified FDA-approved test that has been validated and its  performance characteristics determined by the reporting laboratory.  This laboratory is certified under the Clinical Laboratory Improvement Amendments CLIA as qualified to perform high complexity clinical laboratory testing.    * >=100,000 COLONIES/mL ESCHERICHIA COLI  Urine Culture     Status: Abnormal   Collection Time: 03/08/24 11:56 AM   Specimen: Urine, Cystoscope  Result Value Ref Range Status   Specimen Description   Final    URINE, RANDOM Performed at [facility], [address] [place], [place] [address]    Special Requests  Final    CYSTO Performed at [facility], [address], [place], [place] [address]    Culture (A)  Final    <10,000 COLONIES/mL INSIGNIFICANT GROWTH Performed at [facility], [address] [place], [place] [address]    Report Status 03/09/2024 FINAL  Final     Labs: CBC: Recent Labs  Lab 03/07/24 2322 03/08/24 0343 03/09/24 0448 03/10/24 0438  WBC 8.9 10.0 14.2* 7.4  NEUTROABS 6.8  --   --   --   HGB 12.5 12.1 10.9* 10.4*  HCT 36.8 35.1* 32.2* 31.4*  MCV 95.1 94.4 95.3 96.9  PLT 226 210 209 188   Basic Metabolic Panel: Recent Labs  Lab 03/07/24 2322 03/08/24 0343 03/09/24 0448 03/10/24 0438  NA 142 141 141 142  K 3.8 4.0 3.9 4.1  CL 107 104 107 110  CO2 26 26 24 26   GLUCOSE 121* 110* 148* 90  BUN 25* 21* 21* 24*  CREATININE 1.03* 1.03* 1.08* 1.04*  CALCIUM 8.9 8.7* 8.9 8.5*  MG  --   --  2.2  --   PHOS  --   --  2.5  --    Liver Function Tests: No results for input(s): AST, ALT, ALKPHOS, BILITOT, PROT, ALBUMIN in the last 168 hours. No results for input(s): LIPASE, AMYLASE in the last 168 hours. No results for input(s): AMMONIA in the last 168 hours. Cardiac Enzymes: No results for input(s): CKTOTAL, CKMB, CKMBINDEX, TROPONINI in the last 168 hours. BNP (last 3 results) No results for input(s): BNP in the last 8760 hours. CBG: Recent Labs  Lab 03/09/24 1533  03/09/24 2126 03/10/24 0730 03/10/24 0808  GLUCAP 111* 110* 69* 82    Time spent: 35 minutes  Signed:  Elvan Sor  [facility] 03/10/2024 11:00 AM

## 2024-03-13 ENCOUNTER — Other Ambulatory Visit: Payer: Self-pay

## 2024-03-13 ENCOUNTER — Telehealth: Payer: Self-pay

## 2024-03-13 DIAGNOSIS — N201 Calculus of ureter: Secondary | ICD-10-CM

## 2024-03-13 NOTE — Telephone Encounter (Signed)
 Per Dr. Twylla, Patient is to be scheduled for  Left Ureteroscopy with Laser Lithotripsy and Stent Exchange   Mrs. Dearmond was contacted and possible surgical dates were discussed, Thursday November 20th, 2025 was agreed upon for surgery.   Patient was directed to call [phone] between 1-3pm the day before surgery to find out surgical arrival time.  Instructions were given not to eat or drink from midnight on the night before surgery and have a driver for the day of surgery. On the surgery day patient was instructed to enter through the Medical Mall entrance of [facility] report the Same Day Surgery desk.   Pre-Admit Testing will be in contact via phone to set up an interview with the anesthesia team to review your history and medications prior to surgery.   Reminder of this information was sent via MyChart to the patient.

## 2024-03-13 NOTE — Progress Notes (Signed)
 Surgical Physician Order Form [facility]  Dr. Twylla * Scheduling expectation : 2-3 weeks  *Length of Case:   *Clearance needed: no  *Anticoagulation Instructions: N/A  *Aspirin Instructions: N/A  *Post-op visit Date/Instructions:  1 week cysto stent removal  *Diagnosis: Left Ureteral Stone  *Procedure: left  Ureteroscopy w/laser lithotripsy & stent exchange (47643)   Additional orders: N/A  -Admit type: OUTpatient  -Anesthesia: General  -VTE Prophylaxis Standing Order SCD's       Other:   -Standing Lab Orders Per Anesthesia    Lab other: None  -Standing Test orders EKG/Chest x-ray per Anesthesia       Test other:   - Medications:  Ancef 2gm IV  -Other orders:  N/A

## 2024-03-13 NOTE — Progress Notes (Signed)
   [facility] Posting Form  Surgery Date: Date: 03/23/2024  Surgeon: Dr. Glendia Barba, MD  Inpt ( No  )   Outpt (Yes)   Obs ( No  )   Diagnosis: N20.1 Left Ureteral Stone  -CPT: [id number]  Surgery: Left Ureteroscopy with Laser Lithotripsy and Stent Exchange  Stop Anticoagulations: No  Cardiac/Medical/Pulmonary Clearance needed: no  *Orders entered into EPIC  Date: 03/13/24   *Case booked in [place]  Date: 03/13/24  *Notified pt of Surgery: Date: 03/13/24  PRE-OP UA & CX: no  *Placed into Prior Authorization Work [place] Date: 03/13/24  Assistant/laser/rep:No

## 2024-03-17 ENCOUNTER — Encounter
Admission: RE | Admit: 2024-03-17 | Discharge: 2024-03-17 | Disposition: A | Discharge status: Home / Self Care | Source: Ambulatory Visit | Attending: Urology | Admitting: Urology

## 2024-03-17 ENCOUNTER — Other Ambulatory Visit: Payer: Self-pay

## 2024-03-17 VITALS — Ht 63.0 in | Wt 150.0 lb

## 2024-03-17 DIAGNOSIS — Z01812 Encounter for preprocedural laboratory examination: Secondary | ICD-10-CM

## 2024-03-17 HISTORY — DX: Essential (primary) hypertension: I10

## 2024-03-17 HISTORY — DX: Personal history of urinary calculi: Z87.442

## 2024-03-17 HISTORY — DX: Gastro-esophageal reflux disease without esophagitis: K21.9

## 2024-03-17 HISTORY — DX: Chronic tension-type headache, not intractable: G44.229

## 2024-03-17 NOTE — Patient Instructions (Addendum)
 Your procedure is scheduled on: Thursday 03/23/24  Report to the Registration Desk on the 1st floor of the Medical Mall. To find out your arrival time, please call [phone] between 1PM - 3PM on: Wednesday 03/22/24  If your arrival time is 6:00 am, do not arrive before that time as the Medical Mall entrance doors do not open until 6:00 am.  REMEMBER: Instructions that are not followed completely may result in serious medical risk, up to and including death; or upon the discretion of your surgeon and anesthesiologist your surgery may need to be rescheduled.  Do not eat food or drink fluids after midnight the night before surgery.  No gum chewing or hard candies.   One week prior to surgery: Stop Anti-inflammatories (NSAIDS) such as Advil, Aleve, Ibuprofen, Motrin, Naproxen, Naprosyn and Aspirin based products such as Excedrin, Goody's Powder, BC Powder. Stop ANY OVER THE COUNTER supplements until after surgery.  You may however, continue to take Tylenol  if needed for pain up until the day of surgery.  Stop metFORMIN (GLUCOPHAGE) 500 MG 2 days prior to surgery (take last dose 03/20/24)  Continue taking all of your other prescription medications up until the day of surgery.  ON THE DAY OF SURGERY ONLY TAKE THESE MEDICATIONS WITH SIPS OF WATER:  amLODipine (NORVASC) 2.5 MG  pantoprazole (PROTONIX) 40 MG  tamsulosin  (FLOMAX ) 0.4 MG  nitrofurantoin, macrocrystal-monohydrate, (MACROBID) 100 MG    No Alcohol for 24 hours before or after surgery.  No Smoking including e-cigarettes for 24 hours before surgery.  No chewable tobacco products for at least 6 hours before surgery.  No nicotine patches on the day of surgery.  Do not use any recreational drugs for at least a week (preferably 2 weeks) before your surgery.  Please be advised that the combination of cocaine and anesthesia may have negative outcomes, up to and including death. If you test positive for cocaine, your surgery  will be cancelled.  On the morning of surgery brush your teeth with toothpaste and water, you may rinse your mouth with mouthwash if you wish. Do not swallow any toothpaste or mouthwash.  Take a fresh shower the morning of your procedure.  Do not wear jewelry, make-up, hairpins, clips or nail polish.  For welded (permanent) jewelry: bracelets, anklets, waist bands, etc.  Please have this removed prior to surgery.  If it is not removed, there is a chance that hospital personnel will need to cut it off on the day of surgery.  Do not wear lotions, powders, or perfumes.   Do not shave body hair from the neck down 48 hours before surgery.  Contact lenses, hearing aids and dentures may not be worn into surgery.  Do not bring valuables to the hospital. [facility] is not responsible for any missing/lost belongings or valuables.   Notify your doctor if there is any change in your medical condition (cold, fever, infection).  Wear comfortable clothing (specific to your surgery type) to the hospital.  After surgery, you can help prevent lung complications by doing breathing exercises.  Take deep breaths and cough every 1-2 hours. Your doctor may order a device called an Incentive Spirometer to help you take deep breaths. When coughing or sneezing, hold a pillow firmly against your incision with both hands. This is called "splinting." Doing this helps protect your incision. It also decreases belly discomfort.  If you are being admitted to the hospital overnight, leave your suitcase in the car. After surgery it may be brought  to your room.  In case of increased patient census, it may be necessary for you, the patient, to continue your postoperative care in the Same Day Surgery department.  If you are being discharged the day of surgery, you will not be allowed to drive home. You will need a responsible individual to drive you home and stay with you for 24 hours after surgery.   If you are taking  public transportation, you will need to have a responsible individual with you.  Please call the Pre-admissions Testing Dept. at [phone] if you have any questions about these instructions.  Surgery Visitation Policy:  Patients having surgery or a procedure may have two visitors.  Children under the age of 15 must have an adult with them who is not the patient.  Inpatient Visitation:    Visiting hours are 7 a.m. to 8 p.m. Up to four visitors are allowed at one time in a patient room. The visitors may rotate out with other people during the day.  One visitor age 78 or older may stay with the patient overnight and must be in the room by 8 p.m.   Merchandiser, Retail to address health-related social needs:  [email or web address]

## 2024-03-22 MED ORDER — LACTATED RINGERS IV SOLN: INTRAVENOUS | Status: DC

## 2024-03-22 MED ORDER — ORAL CARE MOUTH RINSE: 15.0000 mL | Freq: Once | OROMUCOSAL | Status: DC

## 2024-03-22 MED ORDER — CEFAZOLIN SODIUM-DEXTROSE 2-4 GM/100ML-% IV SOLN
2.0000 g | INTRAVENOUS | Status: AC
  Administered 2024-03-23: 2 g via INTRAVENOUS

## 2024-03-22 MED ORDER — CHLORHEXIDINE GLUCONATE 0.12 % MT SOLN: 15.0000 mL | Freq: Once | OROMUCOSAL | Status: DC

## 2024-03-23 ENCOUNTER — Other Ambulatory Visit: Payer: Self-pay

## 2024-03-23 ENCOUNTER — Ambulatory Visit

## 2024-03-23 ENCOUNTER — Ambulatory Visit: Admitting: Certified Registered"

## 2024-03-23 ENCOUNTER — Encounter: Admission: RE | Disposition: A | Payer: Self-pay | Source: Home / Self Care | Attending: Urology

## 2024-03-23 ENCOUNTER — Ambulatory Visit
Admission: RE | Admit: 2024-03-23 | Discharge: 2024-03-23 | Disposition: A | Discharge status: Home / Self Care | Payer: Self-pay | Attending: Urology | Admitting: Urology

## 2024-03-23 ENCOUNTER — Encounter: Payer: Self-pay | Admitting: Urology

## 2024-03-23 DIAGNOSIS — E119 Type 2 diabetes mellitus without complications: Secondary | ICD-10-CM | POA: Insufficient documentation

## 2024-03-23 DIAGNOSIS — N201 Calculus of ureter: Secondary | ICD-10-CM

## 2024-03-23 DIAGNOSIS — Z01812 Encounter for preprocedural laboratory examination: Secondary | ICD-10-CM

## 2024-03-23 DIAGNOSIS — N132 Hydronephrosis with renal and ureteral calculous obstruction: Secondary | ICD-10-CM | POA: Diagnosis present

## 2024-03-23 DIAGNOSIS — I10 Essential (primary) hypertension: Secondary | ICD-10-CM | POA: Insufficient documentation

## 2024-03-23 DIAGNOSIS — N136 Pyonephrosis: Secondary | ICD-10-CM | POA: Insufficient documentation

## 2024-03-23 DIAGNOSIS — F17210 Nicotine dependence, cigarettes, uncomplicated: Secondary | ICD-10-CM | POA: Insufficient documentation

## 2024-03-23 DIAGNOSIS — Z7984 Long term (current) use of oral hypoglycemic drugs: Secondary | ICD-10-CM | POA: Diagnosis not present

## 2024-03-23 HISTORY — PX: CYSTOSCOPY/URETEROSCOPY/HOLMIUM LASER/STENT PLACEMENT: SHX6546

## 2024-03-23 LAB — GLUCOSE, CAPILLARY: Glucose-Capillary: 95 mg/dL (ref 70–99)

## 2024-03-23 SURGERY — CYSTOSCOPY/URETEROSCOPY/HOLMIUM LASER/STENT PLACEMENT
Anesthesia: General | Site: Ureter | Laterality: Left

## 2024-03-23 MED ORDER — PROPOFOL 10 MG/ML IV BOLUS
INTRAVENOUS | Status: AC
  Filled 2024-03-23: qty 20

## 2024-03-23 MED ORDER — FENTANYL CITRATE (PF) 100 MCG/2ML IJ SOLN
INTRAMUSCULAR | Status: AC
  Filled 2024-03-23: qty 2

## 2024-03-23 MED ORDER — SODIUM CHLORIDE 0.9 % IR SOLN
Status: DC | PRN
Start: 2024-03-23 — End: 2024-03-23
  Administered 2024-03-23: 3000 mL

## 2024-03-23 MED ORDER — LIDOCAINE HCL (PF) 2 % IJ SOLN
INTRAMUSCULAR | Status: AC
  Filled 2024-03-23: qty 5

## 2024-03-23 MED ORDER — PROPOFOL 10 MG/ML IV BOLUS
INTRAVENOUS | Status: DC | PRN
Start: 2024-03-23 — End: 2024-03-23
  Administered 2024-03-23: 150 mg via INTRAVENOUS

## 2024-03-23 MED ORDER — OXYCODONE HCL 5 MG PO TABS
ORAL_TABLET | ORAL | Status: AC
  Filled 2024-03-23: qty 1

## 2024-03-23 MED ORDER — ACETAMINOPHEN 10 MG/ML IV SOLN
INTRAVENOUS | Status: AC
  Filled 2024-03-23: qty 100

## 2024-03-23 MED ORDER — ONDANSETRON HCL 4 MG/2ML IJ SOLN
INTRAMUSCULAR | Status: DC | PRN
  Administered 2024-03-23: 4 mg via INTRAVENOUS

## 2024-03-23 MED ORDER — ACETAMINOPHEN 10 MG/ML IV SOLN
INTRAVENOUS | Status: DC | PRN
  Administered 2024-03-23: 1000 mg via INTRAVENOUS

## 2024-03-23 MED ORDER — LIDOCAINE HCL (CARDIAC) PF 100 MG/5ML IV SOSY
PREFILLED_SYRINGE | INTRAVENOUS | Status: DC | PRN
  Administered 2024-03-23: 100 mg via INTRAVENOUS

## 2024-03-23 MED ORDER — MIDAZOLAM HCL 2 MG/2ML IJ SOLN
INTRAMUSCULAR | Status: AC
  Filled 2024-03-23: qty 2

## 2024-03-23 MED ORDER — CEFAZOLIN SODIUM-DEXTROSE 2-4 GM/100ML-% IV SOLN
INTRAVENOUS | Status: AC
  Filled 2024-03-23: qty 100

## 2024-03-23 MED ORDER — ROCURONIUM BROMIDE 10 MG/ML (PF) SYRINGE
PREFILLED_SYRINGE | INTRAVENOUS | Status: AC
  Filled 2024-03-23: qty 10

## 2024-03-23 MED ORDER — SUGAMMADEX SODIUM 200 MG/2ML IV SOLN
INTRAVENOUS | Status: DC | PRN
  Administered 2024-03-23: 140 mg via INTRAVENOUS

## 2024-03-23 MED ORDER — FENTANYL CITRATE (PF) 50 MCG/ML IJ SOSY
50.0000 ug | PREFILLED_SYRINGE | Freq: Once | INTRAMUSCULAR | Status: AC
  Administered 2024-03-23: 50 ug via INTRAVENOUS

## 2024-03-23 MED ORDER — FENTANYL CITRATE (PF) 100 MCG/2ML IJ SOLN
INTRAMUSCULAR | Status: DC | PRN
  Administered 2024-03-23 (×2): 50 ug via INTRAVENOUS

## 2024-03-23 MED ORDER — FENTANYL CITRATE (PF) 50 MCG/ML IJ SOSY
PREFILLED_SYRINGE | INTRAMUSCULAR | Status: AC
  Filled 2024-03-23: qty 1

## 2024-03-23 MED ORDER — OXYCODONE HCL 5 MG PO TABS
5.0000 mg | ORAL_TABLET | Freq: Once | ORAL | Status: AC | PRN
  Administered 2024-03-23: 5 mg via ORAL

## 2024-03-23 MED ORDER — DEXAMETHASONE SOD PHOSPHATE PF 10 MG/ML IJ SOLN
INTRAMUSCULAR | Status: DC | PRN
  Administered 2024-03-23: 10 mg via INTRAVENOUS

## 2024-03-23 MED ORDER — CHLORHEXIDINE GLUCONATE 0.12 % MT SOLN
OROMUCOSAL | Status: AC
  Filled 2024-03-23: qty 15

## 2024-03-23 MED ORDER — ROCURONIUM BROMIDE 100 MG/10ML IV SOLN
INTRAVENOUS | Status: DC | PRN
  Administered 2024-03-23: 50 mg via INTRAVENOUS

## 2024-03-23 MED ORDER — MIDAZOLAM HCL (PF) 2 MG/2ML IJ SOLN
INTRAMUSCULAR | Status: DC | PRN
Start: 2024-03-23 — End: 2024-03-23
  Administered 2024-03-23: 2 mg via INTRAVENOUS

## 2024-03-23 MED ORDER — ONDANSETRON HCL 4 MG/2ML IJ SOLN
4.0000 mg | Freq: Once | INTRAMUSCULAR | Status: AC | PRN
  Administered 2024-03-23: 4 mg via INTRAVENOUS

## 2024-03-23 MED ORDER — ONDANSETRON HCL 4 MG/2ML IJ SOLN
INTRAMUSCULAR | Status: AC
  Filled 2024-03-23: qty 2

## 2024-03-23 MED ORDER — FENTANYL CITRATE (PF) 100 MCG/2ML IJ SOLN
25.0000 ug | INTRAMUSCULAR | Status: DC | PRN
  Administered 2024-03-23 (×4): 25 ug via INTRAVENOUS

## 2024-03-23 MED ORDER — OXYCODONE HCL 5 MG/5ML PO SOLN: 5.0000 mg | Freq: Once | ORAL | Status: AC | PRN

## 2024-03-23 MED ORDER — STERILE WATER FOR IRRIGATION IR SOLN
Status: DC | PRN
  Administered 2024-03-23: 500 mL

## 2024-03-23 MED ORDER — IOHEXOL 180 MG/ML  SOLN
INTRAMUSCULAR | Status: DC | PRN
  Administered 2024-03-23 (×2): 10 mL

## 2024-03-23 MED ORDER — OXYCODONE HCL 5 MG PO TABS: 5.0000 mg | ORAL_TABLET | Freq: Four times a day (QID) | ORAL | 0 refills | Status: DC | PRN

## 2024-03-23 SURGICAL SUPPLY — 21 items
BAG DRAIN SIEMENS DORNER NS (MISCELLANEOUS) ×1 IMPLANT
BASKET ZERO TIP 1.9FR (BASKET) IMPLANT
BRUSH SCRUB EZ 4% CHG (MISCELLANEOUS) ×1 IMPLANT
CATH URET FLEX-TIP 2 LUMEN 10F (CATHETERS) IMPLANT
CATH URETL OPEN END 6X70 (CATHETERS) IMPLANT
DRAPE UTILITY 15X26 TOWEL STRL (DRAPES) ×1 IMPLANT
FIBER LASER MOSES 200 DFL (Laser) IMPLANT
GLOVE BIOGEL PI IND STRL 7.5 (GLOVE) ×1 IMPLANT
GOWN STRL REUS W/ TWL LRG LVL3 (GOWN DISPOSABLE) ×1 IMPLANT
GOWN STRL REUS W/ TWL XL LVL3 (GOWN DISPOSABLE) ×1 IMPLANT
GUIDEWIRE STR DUAL SENSOR (WIRE) ×1 IMPLANT
KIT TURNOVER CYSTO (KITS) ×1 IMPLANT
PACK CYSTO AR (MISCELLANEOUS) ×1 IMPLANT
SET CYSTO IRRIGATION (SET/KITS/TRAYS/PACK) ×1 IMPLANT
SHEATH NAVIGATOR HD 12/14X36 (SHEATH) IMPLANT
SOL .9 NS 3000ML IRR UROMATIC (IV SOLUTION) ×1 IMPLANT
SOLN STERILE WATER 500 ML (IV SOLUTION) ×1 IMPLANT
STENT URET 6FRX24 CONTOUR (STENTS) IMPLANT
STENT URET 6FRX26 CONTOUR (STENTS) IMPLANT
SURGILUBE 2OZ TUBE FLIPTOP (MISCELLANEOUS) ×1 IMPLANT
VALVE UROSEAL ADJ ENDO (VALVE) IMPLANT

## 2024-03-23 NOTE — Op Note (Signed)
   Preoperative diagnosis:  Left ureteral calculus  Postoperative diagnosis:  Left ureteral calculi  Procedure:  Cystoscopy Left ureteroscopy and stone removal Ureteroscopic laser lithotripsy Left ureteral stent exchange (31F/24 cm) Left retrograde pyelography with interpretation  Surgeon: Glendia C. Lonzell Dorris, M.D.  Anesthesia: General  Complications: None  Intraoperative findings:  Cystoscopy: Bladder mucosa without solid or papillary lesions; UOs normal-appearing bilaterally.   Ureteroscopy: Inflammatory changes ureteral mucosa left mid ureter.  2 non-impacted calculi lower proximal ureter.  Proximal ureter dilated Left retrograde pyelography post procedure showed no filling defects, stone fragments or contrast extravasation  EBL: Minimal  Specimens: Calculus fragments for analysis   Indication: Amber Mcbride is a 48 y.o. female status post left ureteral stent placement 03/08/2024 for a 10 mm left mid ureteral calculus with severe hydronephrosis/hydroureter and UTI who presents for definitive stone management.  Refer to the admission H&P for details.. After reviewing the management options for treatment, the patient elected to proceed with the above surgical procedure(s). We have discussed the potential benefits and risks of the procedure, side effects of the proposed treatment, the likelihood of the patient achieving the goals of the procedure, and any potential problems that might occur during the procedure or recuperation. Informed consent has been obtained.  Description of procedure:  The patient was taken to the operating room and general anesthesia was induced.  The patient was placed in the dorsal lithotomy position, prepped and draped in the usual sterile fashion, and preoperative antibiotics were administered. A preoperative time-out was performed.   A 21 French cystoscope was lubricated and placed per urethra.  Panendoscopy was performed with findings as described  above  Attention was directed to the left ureteral orifice the indwelling stent was grasped with endoscopic forceps and brought out through the urethral meatus.  The stent was cannulated with a 0.038 Sensor wire which was advanced into the renal pelvis and fluoroscopic guidance without difficulty followed by stent removal.  A 4.5 Fr semirigid ureteroscope was then advanced into the ureter next to the guidewire and to calculi were identified in the proximal ureter as described above.  The calculi was then fragmented with a 200 m Moses holmium laser fiber at a setting of 0.3 J/40 hz.   All fragments were then removed from the ureter with a zero tip nitinol basket.  Reinspection of the ureter revealed no remaining visible stones or fragments.   Retrograde pyelogram was performed with findings as described above.  A 6 F/24 cm Contour ureteral stent with tether was placed under fluoroscopic guidance.  The wire was then removed with an adequate stent curl noted in the renal pelvis as well as in the bladder.  The bladder was then emptied and the procedure ended.  The patient appeared to tolerate the procedure well and without complications.  After anesthetic reversal the patient was transported to the PACU in stable condition.   Plan: She may remove her stent on Saturday, 03/25/2024 Postop follow-up to be scheduled 1 month   Glendia Barba, MD

## 2024-03-23 NOTE — Discharge Instructions (Addendum)
 DISCHARGE INSTRUCTIONS FOR KIDNEY STONE/URETERAL STENT   MEDICATIONS:  1. Resume all your other meds from home.  2.  AZO (over-the-counter) can help with the burning/stinging when you urinate. 3.  Oxycodone  is for moderate/severe pain, Rx was sent to your pharmacy.  ACTIVITY:  1. May resume regular activities in 24 hours. 2. No driving while on narcotic pain medications  3. Drink plenty of water   4. Continue to walk at home - you can still get blood clots when you are at home, so keep active, but don't over do it.  5. May return to work/school tomorrow or when you feel ready   BATHING:  1. You can shower. 2. You have a string coming from your urethra: The stent string is attached to your ureteral stent. Do not pull on this.   SIGNS/SYMPTOMS TO CALL:  Common postoperative symptoms include urinary frequency, urgency, bladder spasm and blood in the urine  Please call us  if you have a fever greater than 101.5, uncontrolled nausea/vomiting, uncontrolled pain, dizziness, unable to urinate, excessively bloody urine, chest pain, shortness of breath, leg swelling, leg pain, or any other concerns or questions.   You can reach us  at [phone].   FOLLOW-UP:  1. You will be contacted for a postop follow-up appointment in ~1 month 2. You have a string attached to your stent, you may remove it on Saturday, 03/25/2024. To do this, pull the string until the stent is completely removed. You may feel an odd sensation in your back.

## 2024-03-23 NOTE — Transfer of Care (Signed)
 Immediate Anesthesia Transfer of Care Note  Patient: Amber Mcbride  Procedure(s) Performed: CYSTOSCOPY/URETEROSCOPY/HOLMIUM LASER/STENT PLACEMENT (Left: Ureter)  Patient Location: PACU  Anesthesia Type:General  Level of Consciousness: awake, alert , and drowsy  Airway & Oxygen Therapy: Patient Spontanous Breathing and Patient connected to face mask oxygen  Post-op Assessment: Report given to RN and Post -op Vital signs reviewed and stable  Post vital signs: Reviewed and stable  Last Vitals:  Vitals Value Taken Time  BP 137/79 03/23/24 16:12  Temp 36.1 1612  Pulse 81 03/23/24 16:14  Resp 19 03/23/24 16:14  SpO2 100 % 03/23/24 16:14  Vitals shown include unfiled device data.  Last Pain:  Vitals:   03/23/24 1124  PainSc: 5          Complications: No notable events documented.

## 2024-03-23 NOTE — Anesthesia Procedure Notes (Signed)
 Procedure Name: Intubation Date/Time: 03/23/2024 3:14 PM  Performed by: Jackye Spanner, CRNAPre-anesthesia Checklist: Patient identified, Patient being monitored, Timeout performed, Emergency Drugs available and Suction available Patient Re-evaluated:Patient Re-evaluated prior to induction Oxygen Delivery Method: Circle system utilized Preoxygenation: Pre-oxygenation with 100% oxygen Induction Type: IV induction Ventilation: Mask ventilation without difficulty Laryngoscope Size: 3 and McGrath Grade View: Grade I Tube type: Oral Tube size: 6.5 mm Number of attempts: 1 Airway Equipment and Method: Stylet Placement Confirmation: ETT inserted through vocal cords under direct vision, positive ETCO2 and breath sounds checked- equal and bilateral Secured at: 20 cm Tube secured with: Tape Dental Injury: Teeth and Oropharynx as per pre-operative assessment  Comments: Smooth atraumatic intubation, no complications noted.

## 2024-03-23 NOTE — Interval H&P Note (Signed)
 History and Physical Interval Note:  03/23/2024 2:50 PM  Amber Mcbride  has presented today for surgery, with the diagnosis of Left Ureteral Stone.  The various methods of treatment have been discussed with the patient and family. After consideration of risks, benefits and other options for treatment, the patient has consented to  Procedure(s) with comments: CYSTOSCOPY/URETEROSCOPY/HOLMIUM LASER/STENT PLACEMENT (Left) - EXCHANGE as a surgical intervention.  The patient's history has been reviewed, patient examined, no change in status, stable for surgery.  I have reviewed the patient's chart and labs.  Questions were answered to the patient's satisfaction.    Interim history: Status post left ureteral stent placement 03/08/2024 for a 10 mm left mid ureteral calculus with severe hydronephrosis/hydroureter and UTI.  She presents today for definitive stone management.  The procedure was discussed in detail and all questions were answered.  CV: RRR Lungs: Clear  Ariza Evans C Hibo Blasdell

## 2024-03-23 NOTE — Anesthesia Preprocedure Evaluation (Signed)
 Anesthesia Evaluation  Patient identified by MRN, date of birth, ID band Patient awake    Reviewed: Allergy & Precautions, NPO status , Patient's Chart, lab work & pertinent test results  Airway Mallampati: II  TM Distance: >3 FB Neck ROM: full    Dental no notable dental hx. (+) Teeth Intact   Pulmonary neg pulmonary ROS, former smoker   Pulmonary exam normal        Cardiovascular Exercise Tolerance: Good hypertension, Pt. on medications negative cardio ROS Normal cardiovascular exam Rhythm:Regular Rate:Normal     Neuro/Psych  Headaches negative neurological ROS  negative psych ROS   GI/Hepatic negative GI ROS, Neg liver ROS,GERD  ,,  Endo/Other  negative endocrine ROSdiabetes, Well Controlled    Renal/GU   negative genitourinary   Musculoskeletal negative musculoskeletal ROS (+)    Abdominal Normal abdominal exam  (+)   Peds negative pediatric ROS (+)  Hematology negative hematology ROS (+)   Anesthesia Other Findings Past Medical History: No date: Chronic tension-type headache, not intractable No date: Essential hypertension No date: GERD (gastroesophageal reflux disease) No date: History of kidney stones No date: Kidney stone No date: Kidney stones  Past Surgical History: 03/08/2024: CYSTOSCOPY W/ URETERAL STENT PLACEMENT; Left     Comment:  Procedure: CYSTOSCOPY, WITH RETROGRADE PYELOGRAM AND               URETERAL STENT INSERTION;  Surgeon: Twylla Glendia BROCKS, MD;              Location: [facility] ORS;  Service: Urology;  Laterality: Left;  BMI    Body Mass Index: 26.56 kg/m      Reproductive/Obstetrics negative OB ROS                              Anesthesia Physical Anesthesia Plan  ASA: 2  Anesthesia Plan: General   Post-op Pain Management:    Induction: Intravenous  PONV Risk Score and Plan: 1 and Ondansetron  and Propofol infusion  Airway Management Planned: Oral  ETT  Additional Equipment:   Intra-op Plan:   Post-operative Plan: Extubation in OR  Informed Consent: I have reviewed the patients History and Physical, chart, labs and discussed the procedure including the risks, benefits and alternatives for the proposed anesthesia with the patient or authorized representative who has indicated his/her understanding and acceptance.     Dental Advisory Given  Plan Discussed with: CRNA  Anesthesia Plan Comments:         Anesthesia Quick Evaluation

## 2024-03-23 NOTE — Anesthesia Postprocedure Evaluation (Signed)
 Anesthesia Post Note  Patient: Amber Mcbride  Procedure(s) Performed: CYSTOSCOPY/URETEROSCOPY/HOLMIUM LASER/STENT PLACEMENT (Left: Ureter)  Patient location during evaluation: PACU Anesthesia Type: General Level of consciousness: awake and alert Pain management: pain level controlled Vital Signs Assessment: post-procedure vital signs reviewed and stable Respiratory status: spontaneous breathing, nonlabored ventilation and respiratory function stable Cardiovascular status: blood pressure returned to baseline and stable Postop Assessment: no apparent nausea or vomiting Anesthetic complications: no   No notable events documented.   Last Vitals:  Vitals:   03/23/24 1725 03/23/24 1740  BP:  134/83  Pulse: 64 69  Resp: (!) 9 (!) 22  Temp:  (!) 36.2 C  SpO2: 92% 100%    Last Pain:  Vitals:   03/23/24 1740  TempSrc: Temporal  PainSc: 0-No pain                 Fairy POUR Jessey Stehlin

## 2024-03-24 ENCOUNTER — Encounter: Payer: Self-pay | Admitting: Urology

## 2024-03-29 ENCOUNTER — Encounter: Payer: Self-pay | Admitting: Urology

## 2024-04-05 LAB — STONE ANALYSIS
Calcium Oxalate Dihydrate: 20 %
Calcium Oxalate Monohydrate: 70 %
Calcium Phosphate (Hydroxyl): 10 %
Weight Calculi: 41 mg

## 2024-05-02 ENCOUNTER — Ambulatory Visit: Admitting: Urology

## 2024-05-03 ENCOUNTER — Ambulatory Visit: Admitting: Urology

## 2024-05-11 ENCOUNTER — Ambulatory Visit (INDEPENDENT_AMBULATORY_CARE_PROVIDER_SITE_OTHER): Payer: Self-pay | Admitting: Podiatry

## 2024-05-11 DIAGNOSIS — Z79899 Other long term (current) drug therapy: Secondary | ICD-10-CM | POA: Diagnosis not present

## 2024-05-11 DIAGNOSIS — B351 Tinea unguium: Secondary | ICD-10-CM

## 2024-05-11 NOTE — Progress Notes (Signed)
 "  Subjective:  Patient ID: Amber Mcbride, female    DOB: 1975-08-02,  MRN: [id number]  Chief Complaint  Patient presents with   Nail Problem    Nail fungus     49 y.o. female presents with the above complaint.  Patient presents for follow-up of right hallux nail fungus.  She states is doing a little bit better.  She has also been applying topical medication.  Wanted to get it evaluated denies any other acute complaints today.  First round of Lamisil  she was able to tolerated.  Review of Systems: Negative except as noted in the HPI. Denies N/V/F/Ch.  Past Medical History:  Diagnosis Date   Chronic tension-type headache, not intractable    Essential hypertension    GERD (gastroesophageal reflux disease)    History of kidney stones    Kidney stone    Kidney stones     Current Outpatient Medications:    amLODipine  (NORVASC ) 2.5 MG tablet, Take 2.5 mg by mouth daily., Disp: , Rfl:    losartan  (COZAAR ) 50 MG tablet, Take 50 mg by mouth 2 (two) times daily., Disp: , Rfl: 11   metFORMIN (GLUCOPHAGE) 500 MG tablet, Take 500 mg by mouth daily., Disp: , Rfl:    oxyCODONE  (OXY IR/ROXICODONE ) 5 MG immediate release tablet, Take 1 tablet (5 mg total) by mouth every 6 (six) hours as needed for severe pain (pain score 7-10)., Disp: 10 tablet, Rfl: 0   pantoprazole  (PROTONIX ) 40 MG tablet, Take 1 tablet (40 mg total) by mouth 2 (two) times daily for 7 days, THEN 1 tablet (40 mg total) daily for 14 days., Disp: 28 tablet, Rfl: 0  Social History   Tobacco Use  Smoking Status Former   Current packs/day: 1.00   Average packs/day: 1 pack/day for 35.0 years (35.0 ttl pk-yrs)   Types: Cigarettes   Start date: 51  Smokeless Tobacco Never    No Known Allergies Objective:  There were no vitals filed for this visit. There is no height or weight on file to calculate BMI. Constitutional Well developed. Well nourished.  Vascular Dorsalis pedis pulses palpable bilaterally. Posterior tibial pulses  palpable bilaterally. Capillary refill normal to all digits.  No cyanosis or clubbing noted. Pedal hair growth normal.  Neurologic Normal speech. Oriented to person, place, and time. Epicritic sensation to light touch grossly present bilaterally.  Dermatologic Nails thickened elongated dystrophic mycotic toenails x1 right hallux Skin within normal limits  Orthopedic: Normal joint ROM without pain or crepitus bilaterally. No visible deformities. No bony tenderness.   Radiographs: None Assessment:   1. Long-term use of high-risk medication   2. Onychomycosis due to dermatophyte     Plan:  Patient was evaluated and treated and all questions answered.  Right hallux onychomycosis~second round -Educated the patient on the etiology of onychomycosis and various treatment options associated with improving the fungal load.  I explained to the patient that there is 3 treatment options available to treat the onychomycosis including topical, p.o., laser treatment.  Patient elected to undergo p.o. options with Lamisil /terbinafine  therapy.  In order for me to start the medication therapy, I explained to the patient the importance of evaluating the liver and obtaining the liver function test.  Once the liver function test comes back normal I will start him on 11-month course of Lamisil  therapy.  Patient understood all risk and would like to proceed with Lamisil  therapy.  I have asked the patient to immediately stop the Lamisil  therapy if she has  any reactions to it and call the office or go to the emergency room right away.  Patient states understanding   No follow-ups on file. "

## 2024-05-12 ENCOUNTER — Other Ambulatory Visit: Payer: Self-pay | Admitting: Podiatry

## 2024-05-12 LAB — HEPATIC FUNCTION PANEL
ALT: 30 IU/L (ref 0–32)
AST: 28 IU/L (ref 0–40)
Albumin: 4.5 g/dL (ref 3.9–4.9)
Alkaline Phosphatase: 81 IU/L (ref 41–116)
Bilirubin Total: 0.4 mg/dL (ref 0.0–1.2)
Bilirubin, Direct: 0.12 mg/dL (ref 0.00–0.40)
Total Protein: 6.8 g/dL (ref 6.0–8.5)

## 2024-05-12 MED ORDER — TERBINAFINE HCL 250 MG PO TABS: 250.0000 mg | ORAL_TABLET | Freq: Every day | ORAL | 0 refills | Status: DC

## 2024-05-17 ENCOUNTER — Ambulatory Visit: Admitting: Urology

## 2024-05-17 ENCOUNTER — Encounter: Payer: Self-pay | Admitting: Urology

## 2024-05-17 VITALS — BP 126/84 | HR 79 | Ht 62.0 in | Wt 150.0 lb

## 2024-05-17 DIAGNOSIS — N200 Calculus of kidney: Secondary | ICD-10-CM | POA: Diagnosis not present

## 2024-05-17 NOTE — Patient Instructions (Signed)

## 2024-05-17 NOTE — Progress Notes (Signed)
 "  05/17/2024 3:08 PM   [name] [birth year] [id number]  Referring provider: Murvin Ricka HERO, MD [address] [place],  [place] [address]  Chief Complaint  Patient presents with   Other    HPI: Amber Mcbride is a 49 y.o. female who dents for postop follow-up.  S/p left ureteroscopic stone removal/laser lithotripsy 03/23/2024 for 2 non-impacted calculi in the lower proximal ureter Left ureteral stent was placed 03/08/2024 for obstructing calculus with possible infection Stent was left on a tether which she removed 2 days postop and no problems after stent removal Stone analysis CaOxMono/CaOxDi/calcium phosphate: 70/20/10   PMH: Past Medical History:  Diagnosis Date   Chronic tension-type headache, not intractable    Essential hypertension    GERD (gastroesophageal reflux disease)    History of kidney stones    Kidney stone    Kidney stones     Surgical History: Past Surgical History:  Procedure Laterality Date   CYSTOSCOPY W/ URETERAL STENT PLACEMENT Left 03/08/2024   Procedure: CYSTOSCOPY, WITH RETROGRADE PYELOGRAM AND URETERAL STENT INSERTION;  Surgeon: Twylla Glendia BROCKS, MD;  Location: [facility];  Service: Urology;  Laterality: Left;   CYSTOSCOPY/URETEROSCOPY/HOLMIUM LASER/STENT PLACEMENT Left 03/23/2024   Procedure: CYSTOSCOPY/URETEROSCOPY/HOLMIUM LASER/STENT PLACEMENT;  Surgeon: Twylla Glendia BROCKS, MD;  Location: [facility];  Service: Urology;  Laterality: Left;  EXCHANGE    Home Medications:  Allergies as of 05/17/2024   No Known Allergies      Medication List        Accurate as of May 17, 2024  3:08 PM. If you have any questions, ask your nurse or doctor.          STOP taking these medications    oxyCODONE  5 MG immediate release tablet Commonly known as: Oxy IR/ROXICODONE  Stopped by: Glendia Twylla, MD   pantoprazole  40 MG tablet Commonly known as: PROTONIX  Stopped by: Glendia Twylla, MD   terbinafine  250 MG tablet Commonly known as:  LAMISIL  Stopped by: Glendia Twylla, MD       TAKE these medications    amLODipine  2.5 MG tablet Commonly known as: NORVASC  Take 2.5 mg by mouth daily.   losartan  50 MG tablet Commonly known as: COZAAR  Take 50 mg by mouth 2 (two) times daily.   metFORMIN 500 MG tablet Commonly known as: GLUCOPHAGE Take 500 mg by mouth daily.        Allergies: Allergies[1]  Family History: No family history on file.  Social History:  reports that she has quit smoking. Her smoking use included cigarettes. She started smoking about 35 years ago. She has a 35 pack-year smoking history. She has never used smokeless tobacco. She reports that she does not drink alcohol and does not use drugs.   Physical Exam: BP 126/84   Pulse 79   Ht 5' 2 (1.575 m)   Wt 150 lb (68 kg)   BMI 27.44 kg/m   Constitutional:  Alert, No acute distress. HEENT: [place] AT Respiratory: Normal respiratory effort, no increased work of breathing. Psychiatric: Normal mood and affect.   Assessment & Plan:    1.  Recurrent nephrolithiasis Doing well status post ureteroscopic stone removal Recommend metabolic stone evaluation to include 24-hour urine study and blood work.  She would like to proceed with further evaluation.  Will contact with results 47-month follow-up with KUB   Glendia BROCKS Twylla, MD  [facility] [address], [address] [place], [place] [address] [phone]     [1] No Known  Allergies  "

## 2024-11-14 ENCOUNTER — Ambulatory Visit: Admitting: Urology
